# Patient Record
Sex: Male | Born: 1955 | Race: White | Hispanic: No | Marital: Married | Smoking: Never smoker
Health system: Southern US, Community
[De-identification: ages and names within clinical notes are randomized; demographics above are authoritative.]

## PROBLEM LIST (undated history)

## (undated) DIAGNOSIS — I10 Essential (primary) hypertension: Secondary | ICD-10-CM

## (undated) DIAGNOSIS — E119 Type 2 diabetes mellitus without complications: Secondary | ICD-10-CM

## (undated) HISTORY — DX: Type 2 diabetes mellitus without complications: E11.9

## (undated) HISTORY — PX: ADENOIDECTOMY: SUR15

## (undated) HISTORY — PX: OTHER SURGICAL HISTORY: SHX169

---

## 2015-12-03 ENCOUNTER — Encounter: Payer: Self-pay | Admitting: *Deleted

## 2015-12-03 ENCOUNTER — Encounter: Payer: BLUE CROSS/BLUE SHIELD | Attending: Family Medicine | Admitting: *Deleted

## 2015-12-03 VITALS — BP 130/72 | Ht 68.0 in | Wt 133.7 lb

## 2015-12-03 DIAGNOSIS — E119 Type 2 diabetes mellitus without complications: Secondary | ICD-10-CM | POA: Diagnosis present

## 2015-12-03 NOTE — Patient Instructions (Addendum)
Check blood sugars 2 x day before breakfast and 2 hrs after supper every day Exercise: Continue walking for   45-50  minutes  7  days a week Eat 3 meals day,  2-3  snacks a day Space meals 4-6 hours apart Bring blood sugar records to the next class Carry fast acting glucose and a snack at all times

## 2015-12-04 NOTE — Progress Notes (Signed)
Diabetes Self-Management Education  Visit Type: First/Initial  Appt. Start Time: 1600 Appt. End Time: 1715  12/03/2015  Terrance Watson, identified by name and date of birth, is a 60 y.o. male with a diagnosis of Diabetes: Type 2.   ASSESSMENT  Blood pressure 130/72, height  (1.727 m), weight 133 lb 11.2 oz (60.646 kg). Body mass index is 20.33 kg/(m^2).      Diabetes Self-Management Education - 12/03/15 1739    Visit Information   Visit Type First/Initial   Initial Visit   Diabetes Type Type 2   Are you currently following a meal plan? Yes   What type of meal plan do you follow? "cut out all sodas and cut back on sugar intake"   Are you taking your medications as prescribed? Yes   Date Diagnosed 10/23/15   Health Coping   How would you rate your overall health? Good   Psychosocial Assessment   Patient Belief/Attitude about Diabetes Motivated to manage diabetes   Self-care barriers None   Self-management support Doctor's office;Family   Other persons present Spouse/SO   Patient Concerns Nutrition/Meal planning;Glycemic Control;Medication;Problem Solving;Healthy Lifestyle   Special Needs None   Preferred Learning Style Auditory;Visual;Hands on   Learning Readiness Change in progress   How often do you need to have someone help you when you read instructions, pamphlets, or other written materials from your doctor or pharmacy? 1 - Never   What is the last grade level you completed in school? college   Pre-Education Assessment   Patient understands the diabetes disease and treatment process. Needs Instruction   Patient understands incorporating nutritional management into lifestyle. Needs Instruction   Patient undertands incorporating physical activity into lifestyle. Needs Instruction   Patient understands using medications safely. Needs Instruction   Patient understands monitoring blood glucose, interpreting and using results Needs Instruction   Patient understands  prevention, detection, and treatment of acute complications. Needs Instruction   Patient understands prevention, detection, and treatment of chronic complications. Needs Instruction   Patient understands how to develop strategies to address psychosocial issues. Needs Instruction   Patient understands how to develop strategies to promote health/change behavior. Needs Instruction   Complications   Last HgB A1C per patient/outside source 13.2 %  10/23/15   How often do you check your blood sugar? 3-4 times/day   Fasting Blood glucose range (mg/dL) 19-147;829-562  Pt reports FBG's 120-154 mg/dL   Postprandial Blood glucose range (mg/dL) --  pre-lunch and pre-supper readings average 130-135 mg/dL   Have you had a dilated eye exam in the past 12 months? Yes   Have you had a dental exam in the past 12 months? Yes   Are you checking your feet? Yes   How many days per week are you checking your feet? 7   Dietary Intake   Breakfast eggs, tomato, cheese and ham omelet; cereal and milk with fruit   Snack (morning) 1/2 [place] yogurt with nuts; 1/2 granola bar   Lunch grilled chicken salad or just grilled chicken; soft taco   Snack (afternoon) apple and peanut butter; 1/2 banana   Dinner meat and peas or beans; non-starchy vegetables   Snack (evening) popcorn, nuts   Beverage(s) water   Exercise   Exercise Type Light (walking / raking leaves)   How many days per week to you exercise? 7   How many minutes per day do you exercise? 45   Total minutes per week of exercise 315   Patient Education  Previous Diabetes Education No   Disease state  Definition of diabetes, type 1 and 2, and the diagnosis of diabetes   Nutrition management  Role of diet in the treatment of diabetes and the relationship between the three main macronutrients and blood glucose level   Physical activity and exercise  Role of exercise on diabetes management, blood pressure control and cardiac health.   Medications Reviewed  patients medication for diabetes, action, purpose, timing of dose and side effects.   Monitoring Purpose and frequency of SMBG.;Identified appropriate SMBG and/or A1C goals.   Acute complications Taught treatment of hypoglycemia - the 15 rule.   Chronic complications Relationship between chronic complications and blood glucose control   Psychosocial adjustment Identified and addressed patients feelings and concerns about diabetes   Individualized Goals (developed by patient)   Reducing Risk Improve blood sugars Decrease medications Prevent diabetes complications Lead a healthier lifestyle Become more fit   Outcomes   Expected Outcomes Demonstrated interest in learning. Expect positive outcomes   Future DMSE 4-6 wks      Individualized Plan for Diabetes Self-Management Training:   Learning Objective:  Patient will have a greater understanding of diabetes self-management. Patient education plan is to attend individual and/or group sessions per assessed needs and concerns.   Plan:   Patient Instructions  Check blood sugars 2 x day before breakfast and 2 hrs after supper every day Exercise: Continue walking for   45-50  minutes  7  days a week Eat 3 meals day,  2-3  snacks a day Space meals 4-6 hours apart Bring blood sugar records to the next class Carry fast acting glucose and a snack at all times   Expected Outcomes:  Demonstrated interest in learning. Expect positive outcomes  Education material provided:  General Meal Planning Guidelines Simple Meal Plan Symptoms, causes and treatments of Hypoglycemia  If problems or questions, patient to contact team via:  Sharion Settler, RN, CCM, CDE [phone]  Future DSME appointment: 4-6 wks  January 07, 2016 for Diabetes Class 1

## 2015-12-06 ENCOUNTER — Encounter (INDEPENDENT_AMBULATORY_CARE_PROVIDER_SITE_OTHER): Payer: Self-pay

## 2016-01-07 ENCOUNTER — Encounter: Payer: Self-pay | Admitting: Dietician

## 2016-01-07 ENCOUNTER — Encounter: Payer: BLUE CROSS/BLUE SHIELD | Attending: Family Medicine | Admitting: Dietician

## 2016-01-07 VITALS — Ht 68.0 in | Wt 140.5 lb

## 2016-01-07 DIAGNOSIS — E119 Type 2 diabetes mellitus without complications: Secondary | ICD-10-CM

## 2016-01-07 NOTE — Progress Notes (Signed)

## 2016-01-14 ENCOUNTER — Encounter: Payer: Self-pay | Admitting: Dietician

## 2016-01-14 ENCOUNTER — Encounter: Payer: BLUE CROSS/BLUE SHIELD | Attending: Family Medicine | Admitting: Dietician

## 2016-01-14 VITALS — Ht 68.0 in | Wt 139.3 lb

## 2016-01-14 DIAGNOSIS — E119 Type 2 diabetes mellitus without complications: Secondary | ICD-10-CM | POA: Insufficient documentation

## 2016-01-14 NOTE — Progress Notes (Signed)

## 2016-01-21 ENCOUNTER — Encounter: Payer: BLUE CROSS/BLUE SHIELD | Admitting: Dietician

## 2016-01-21 ENCOUNTER — Encounter: Payer: Self-pay | Admitting: Dietician

## 2016-01-21 VITALS — BP 122/74 | Ht 68.0 in | Wt 140.7 lb

## 2016-01-21 DIAGNOSIS — E119 Type 2 diabetes mellitus without complications: Secondary | ICD-10-CM

## 2016-01-21 NOTE — Progress Notes (Signed)

## 2016-01-30 ENCOUNTER — Encounter: Payer: Self-pay | Admitting: *Deleted

## 2016-02-18 ENCOUNTER — Ambulatory Visit: Payer: BLUE CROSS/BLUE SHIELD | Admitting: Anesthesiology

## 2016-02-18 ENCOUNTER — Ambulatory Visit
Admission: RE | Admit: 2016-02-18 | Discharge: 2016-02-18 | Disposition: A | Discharge status: Home / Self Care | Payer: BLUE CROSS/BLUE SHIELD | Source: Ambulatory Visit | Attending: Gastroenterology | Admitting: Gastroenterology

## 2016-02-18 ENCOUNTER — Encounter: Payer: Self-pay | Admitting: *Deleted

## 2016-02-18 ENCOUNTER — Encounter
Admission: RE | Disposition: A | Discharge status: Home / Self Care | Payer: Self-pay | Source: Ambulatory Visit | Attending: Gastroenterology

## 2016-02-18 DIAGNOSIS — Z7984 Long term (current) use of oral hypoglycemic drugs: Secondary | ICD-10-CM | POA: Diagnosis not present

## 2016-02-18 DIAGNOSIS — K640 First degree hemorrhoids: Secondary | ICD-10-CM | POA: Insufficient documentation

## 2016-02-18 DIAGNOSIS — Z880 Allergy status to penicillin: Secondary | ICD-10-CM | POA: Diagnosis not present

## 2016-02-18 DIAGNOSIS — K5730 Diverticulosis of large intestine without perforation or abscess without bleeding: Secondary | ICD-10-CM | POA: Diagnosis not present

## 2016-02-18 DIAGNOSIS — K2960 Other gastritis without bleeding: Secondary | ICD-10-CM | POA: Diagnosis not present

## 2016-02-18 DIAGNOSIS — K210 Gastro-esophageal reflux disease with esophagitis: Secondary | ICD-10-CM | POA: Diagnosis not present

## 2016-02-18 DIAGNOSIS — E119 Type 2 diabetes mellitus without complications: Secondary | ICD-10-CM | POA: Insufficient documentation

## 2016-02-18 DIAGNOSIS — K317 Polyp of stomach and duodenum: Secondary | ICD-10-CM | POA: Diagnosis not present

## 2016-02-18 DIAGNOSIS — R634 Abnormal weight loss: Secondary | ICD-10-CM | POA: Diagnosis present

## 2016-02-18 HISTORY — PX: ESOPHAGOGASTRODUODENOSCOPY (EGD) WITH PROPOFOL: SHX5813

## 2016-02-18 HISTORY — PX: COLONOSCOPY WITH PROPOFOL: SHX5780

## 2016-02-18 LAB — GLUCOSE, CAPILLARY: GLUCOSE-CAPILLARY: 135 mg/dL — AB (ref 65–99)

## 2016-02-18 SURGERY — COLONOSCOPY WITH PROPOFOL
Anesthesia: General

## 2016-02-18 MED ORDER — EPHEDRINE SULFATE 50 MG/ML IJ SOLN
INTRAMUSCULAR | Status: DC | PRN
  Administered 2016-02-18: 5 mg via INTRAVENOUS

## 2016-02-18 MED ORDER — FENTANYL CITRATE (PF) 100 MCG/2ML IJ SOLN
INTRAMUSCULAR | Status: DC | PRN
  Administered 2016-02-18: 50 ug via INTRAVENOUS

## 2016-02-18 MED ORDER — PROPOFOL 500 MG/50ML IV EMUL
INTRAVENOUS | Status: DC | PRN
  Administered 2016-02-18: 120 ug/kg/min via INTRAVENOUS

## 2016-02-18 MED ORDER — SODIUM CHLORIDE 0.9 % IV SOLN
INTRAVENOUS | Status: DC
  Administered 2016-02-18: 1000 mL via INTRAVENOUS
  Administered 2016-02-18: 09:00:00 via INTRAVENOUS

## 2016-02-18 MED ORDER — MIDAZOLAM HCL 2 MG/2ML IJ SOLN
INTRAMUSCULAR | Status: DC | PRN
Start: 2016-02-18 — End: 2016-02-18
  Administered 2016-02-18: 1 mg via INTRAVENOUS

## 2016-02-18 MED ORDER — SODIUM CHLORIDE 0.9 % IV SOLN: INTRAVENOUS | Status: DC

## 2016-02-18 NOTE — Anesthesia Preprocedure Evaluation (Signed)
Anesthesia Evaluation  Patient identified by MRN, date of birth, ID band Patient awake    Reviewed: Allergy & Precautions, H&P , NPO status , Patient's Chart, lab work & pertinent test results, reviewed documented beta blocker date and time   Airway Mallampati: II   Neck ROM: full    Dental  (+) Poor Dentition   Pulmonary neg pulmonary ROS,    Pulmonary exam normal        Cardiovascular negative cardio ROS Normal cardiovascular exam Rhythm:regular Rate:Normal     Neuro/Psych negative neurological ROS  negative psych ROS   GI/Hepatic negative GI ROS, Neg liver ROS,   Endo/Other  negative endocrine ROSdiabetes  Renal/GU negative Renal ROS  negative genitourinary   Musculoskeletal   Abdominal   Peds  Hematology negative hematology ROS (+)   Anesthesia Other Findings Past Medical History: No date: Diabetes mellitus without complication (HCC) Past Surgical History: No date: ADENOIDECTOMY No date: Surgery for broken nose BMI    Body Mass Index:  20.68 kg/m     Reproductive/Obstetrics                             Anesthesia Physical Anesthesia Plan  ASA: II  Anesthesia Plan: General   Post-op Pain Management:    Induction:   Airway Management Planned:   Additional Equipment:   Intra-op Plan:   Post-operative Plan:   Informed Consent: I have reviewed the patients History and Physical, chart, labs and discussed the procedure including the risks, benefits and alternatives for the proposed anesthesia with the patient or authorized representative who has indicated his/her understanding and acceptance.   Dental Advisory Given  Plan Discussed with: CRNA  Anesthesia Plan Comments:         Anesthesia Quick Evaluation

## 2016-02-18 NOTE — Anesthesia Postprocedure Evaluation (Signed)
Anesthesia Post Note  Patient: Terrance Watson  Procedure(s) Performed: Procedure(s) (LRB): COLONOSCOPY WITH PROPOFOL (N/A) ESOPHAGOGASTRODUODENOSCOPY (EGD) WITH PROPOFOL (N/A)  Patient location during evaluation: PACU Anesthesia Type: General Level of consciousness: awake and alert Pain management: pain level controlled Vital Signs Assessment: post-procedure vital signs reviewed and stable Respiratory status: spontaneous breathing, nonlabored ventilation, respiratory function stable and patient connected to nasal cannula oxygen Cardiovascular status: blood pressure returned to baseline and stable Postop Assessment: no signs of nausea or vomiting Anesthetic complications: no    Last Vitals:  Vitals:   02/18/16 1016 02/18/16 1026  BP: 137/82 140/81  Pulse: 94 95  Resp: 19 13  Temp:      Last Pain:  Vitals:   02/18/16 0956  TempSrc: Tympanic                 Yevette Edwards

## 2016-02-18 NOTE — Op Note (Signed)
[facility] Gastroenterology Patient Name: Terrance Watson Procedure Date: 02/18/2016 8:52 AM MRN: [id number] Account #: [id number] Date of Birth: [birth year] Admit Type: Outpatient Age: 60 Room: [facility] ENDO ROOM 3 Gender: Male Note Status: Finalized Procedure:            Colonoscopy Indications:          Change in bowel habits, Weight loss Providers:            Christena Deem, MD Referring MD:         Hassell Halim MD (Referring MD) Medicines:            Monitored Anesthesia Care Complications:        No immediate complications. Procedure:            Pre-Anesthesia Assessment:                       - ASA Grade Assessment: II - A patient with mild                        systemic disease.                       After obtaining informed consent, the colonoscope was                        passed under direct vision. Throughout the procedure,                        the patient's blood pressure, pulse, and oxygen                        saturations were monitored continuously. The                        Colonoscope was introduced through the anus and                        advanced to the the cecum, identified by appendiceal                        orifice and ileocecal valve. The colonoscopy was                        unusually difficult due to poor bowel prep and a                        tortuous colon. Successful completion of the procedure                        was aided by lavage. The patient tolerated the                        procedure well. The quality of the bowel preparation                        was fair. Findings:      Multiple medium-mouthed diverticula were found in the sigmoid colon and       descending colon.      The sigmoid colon and descending colon were significantly tortuous.      Biopsies for histology were  taken with a cold forceps from the right       colon and left colon for evaluation of microscopic colitis.      Non-bleeding internal  hemorrhoids were found during anoscopy. The       hemorrhoids were small and Grade I (internal hemorrhoids that do not       prolapse).      The digital rectal exam was normal. Impression:           - Preparation of the colon was fair.                       - Diverticulosis in the sigmoid colon and in the                        descending colon.                       - Tortuous colon.                       - Non-bleeding internal hemorrhoids.                       - Biopsies were taken with a cold forceps from the                        right colon and left colon for evaluation of                        microscopic colitis. Recommendation:       - Discharge patient to home. Procedure Code(s):    --- Professional ---                       [phone], Colonoscopy, flexible; with biopsy, single or                        multiple Diagnosis Code(s):    --- Professional ---                       K64.0, First degree hemorrhoids                       R19.4, Change in bowel habit                       R63.4, Abnormal weight loss                       K57.30, Diverticulosis of large intestine without                        perforation or abscess without bleeding                       Q43.8, Other specified congenital malformations of                        intestine CPT copyright 2016 American Medical Association. All rights reserved. The codes documented in this report are preliminary and upon coder review may  be revised to meet current compliance requirements. Christena Deem, MD 02/18/2016 9:54:45 AM This report has been signed electronically. Number of Addenda: 0  Note Initiated On: 02/18/2016 8:52 AM Scope Withdrawal Time: 0 hours 7 minutes 41 seconds  Total Procedure Duration: 0 hours 31 minutes 11 seconds       [facility]

## 2016-02-18 NOTE — Transfer of Care (Signed)
Immediate Anesthesia Transfer of Care Note  Patient: Hendryx Snidow  Procedure(s) Performed: Procedure(s): COLONOSCOPY WITH PROPOFOL (N/A) ESOPHAGOGASTRODUODENOSCOPY (EGD) WITH PROPOFOL (N/A)  Patient Location: PACU  Anesthesia Type:General  Level of Consciousness: awake  Airway & Oxygen Therapy: Patient Spontanous Breathing and Patient connected to face mask oxygen  Post-op Assessment: Report given to RN and Post -op Vital signs reviewed and stable  Post vital signs: Reviewed and stable  Last Vitals:  Vitals:   02/18/16 0747  BP: (!) 156/93  Pulse: 98  Resp: 14  Temp: 36.6 C    Last Pain:  Vitals:   02/18/16 0747  TempSrc: Tympanic         Complications: No apparent anesthesia complications

## 2016-02-18 NOTE — Op Note (Signed)
[facility] Gastroenterology Patient Name: Terrance Watson Procedure Date: 02/18/2016 8:52 AM MRN: [id number] Account #: [id number] Date of Birth: [birth year] Admit Type: Outpatient Age: 60 Room: [facility] ENDO ROOM 3 Gender: Male Note Status: Finalized Procedure:            Upper GI endoscopy Indications:          Dyspepsia Providers:            Christena Deem, MD Referring MD:         Hassell Halim MD (Referring MD) Complications:        No immediate complications. Procedure:            Pre-Anesthesia Assessment:                       - ASA Grade Assessment: II - A patient with mild                        systemic disease.                       After obtaining informed consent, the endoscope was                        passed under direct vision. Throughout the procedure,                        the patient's blood pressure, pulse, and oxygen                        saturations were monitored continuously. The Endoscope                        was introduced through the mouth, and advanced to the                        third part of duodenum. The upper GI endoscopy was                        accomplished without difficulty. The patient tolerated                        the procedure well. Findings:      LA Grade B (one or more mucosal breaks greater than 5 mm, not extending       between the tops of two mucosal folds) esophagitis with no bleeding was       found. Biopsies were taken with a cold forceps for histology.      The exam of the esophagus was otherwise normal.      Diffuse and patchy mild inflammation characterized by congestion (edema)       and erythema was found in the gastric body and in the gastric antrum.       Biopsies were taken with a cold forceps for histology.      The cardia and gastric fundus were normal on retroflexion.      A few 1 mm sessile polyps with no bleeding were found in the gastric       body.      The examined duodenum was  normal. Impression:           - LA Grade B erosive esophagitis. Biopsied.                       -  Erosive gastritis. Biopsied.                       - A few gastric polyps.                       - Normal examined duodenum. Recommendation:       - Discharge patient to home.                       - Use Prilosec (omeprazole) 20 mg PO daily daily.                       - Await pathology results. Procedure Code(s):    --- Professional ---                       [phone], Esophagogastroduodenoscopy, flexible, transoral;                        with biopsy, single or multiple Diagnosis Code(s):    --- Professional ---                       K20.8, Other esophagitis                       K29.60, Other gastritis without bleeding                       K31.7, Polyp of stomach and duodenum                       R10.13, Epigastric pain CPT copyright 2016 American Medical Association. All rights reserved. The codes documented in this report are preliminary and upon coder review may  be revised to meet current compliance requirements. Christena DeemMartin U Skulskie, MD 02/18/2016 9:14:10 AM This report has been signed electronically. Number of Addenda: 0 Note Initiated On: 02/18/2016 8:52 AM      [facility]

## 2016-02-18 NOTE — H&P (Signed)
Outpatient short stay form Pre-procedure 02/18/2016 8:51 AM Christena Deem MD  Primary Physician: Dr Kandyce Rud  Reason for visit:  EGD and colonoscopy  History of present illness:  Patient is a 60 year old male presenting today as above. He has a history of some recent weight loss however this seems to improve after his diagnosis and beginning treatment for diabetes. He has had a change of bowel habits of preexisted he is beginning metformin. He also has some amount of dyspepsia. This has responded to use of proton pump inhibitor. He denies use of aspirin or blood thinning agents.    Current Facility-Administered Medications:  .  0.9 %  sodium chloride infusion, , Intravenous, Continuous, Christena Deem, MD, Last Rate: 20 mL/hr at 02/18/16 0805, 1,000 mL at 02/18/16 0805 .  0.9 %  sodium chloride infusion, , Intravenous, Continuous, Christena Deem, MD  Prescriptions Prior to Admission  Medication Sig Dispense Refill Last Dose  . metFORMIN (GLUCOPHAGE-XR) 750 MG 24 hr tablet Take 1,000 mg by mouth daily with supper.     . Multiple Vitamin (MULTIVITAMIN) tablet Take 1 tablet by mouth daily.     Marland Kitchen saccharomyces boulardii (FLORASTOR) 250 MG capsule Take 250 mg by mouth every morning.     Marland Kitchen glipiZIDE (GLUCOTROL) 5 MG tablet Take 5 mg by mouth daily before breakfast.   Taking  . metFORMIN (GLUCOPHAGE) 500 MG tablet Take 1,000 mg by mouth daily with supper.   Completed Course at Unknown time  . omeprazole (PRILOSEC) 20 MG capsule Take 20 mg by mouth daily.   Taking     Allergies  Allergen Reactions  . Penicillins Rash     Past Medical History:  Diagnosis Date  . Diabetes mellitus without complication (HCC)     Review of systems:      Physical Exam    Heart and lungs: Regular rate and rhythm without rub or gallop, lungs are bilaterally clear.    HEENT: Normocephalic atraumatic eyes are anicteric    Other:     Pertinant exam for procedure: Soft nontender  nondistended bowel sounds positive normoactive.    Planned proceedures: EGD, colonoscopy and indicated procedures. I have discussed the risks benefits and complications of procedures to include not limited to bleeding, infection, perforation and the risk of sedation and the patient wishes to proceed.    Christena Deem, MD Gastroenterology 02/18/2016  8:51 AM

## 2016-02-18 NOTE — Anesthesia Postprocedure Evaluation (Signed)
Anesthesia Post Note  Patient: Nivaan Clayton  Procedure(s) Performed: Procedure(s) (LRB): COLONOSCOPY WITH PROPOFOL (N/A) ESOPHAGOGASTRODUODENOSCOPY (EGD) WITH PROPOFOL (N/A)  Patient location during evaluation: PACU Anesthesia Type: General Level of consciousness: awake and alert Pain management: pain level controlled Vital Signs Assessment: post-procedure vital signs reviewed and stable Respiratory status: spontaneous breathing, nonlabored ventilation, respiratory function stable and patient connected to nasal cannula oxygen Cardiovascular status: blood pressure returned to baseline and stable Postop Assessment: no signs of nausea or vomiting Anesthetic complications: no    Last Vitals:  Vitals:   02/18/16 1016 02/18/16 1026  BP: 137/82 140/81  Pulse: 94 95  Resp: 19 13  Temp:      Last Pain:  Vitals:   02/18/16 0956  TempSrc: Tympanic                 Lashaunda Schild G Devyn Griffing     

## 2016-02-18 NOTE — Anesthesia Procedure Notes (Signed)
Performed by: COOK-MARTIN, Annasofia Pohl Pre-anesthesia Checklist: Patient identified, Emergency Drugs available, Suction available, Patient being monitored and Timeout performed Patient Re-evaluated:Patient Re-evaluated prior to inductionOxygen Delivery Method: Nasal cannula Preoxygenation: Pre-oxygenation with 100% oxygen Intubation Type: IV induction Placement Confirmation: positive ETCO2 and CO2 detector       

## 2016-02-19 ENCOUNTER — Encounter: Payer: Self-pay | Admitting: Gastroenterology

## 2016-02-19 LAB — SURGICAL PATHOLOGY

## 2019-12-05 ENCOUNTER — Inpatient Hospital Stay
Admission: EM | Admit: 2019-12-05 | Discharge: 2020-01-08 | DRG: 291 | Disposition: E | Payer: BC Managed Care – PPO | Attending: Internal Medicine | Admitting: Internal Medicine

## 2019-12-05 ENCOUNTER — Inpatient Hospital Stay
Admit: 2019-12-05 | Discharge: 2019-12-05 | Disposition: A | Discharge status: Home / Self Care | Payer: BC Managed Care – PPO | Attending: Internal Medicine | Admitting: Internal Medicine

## 2019-12-05 ENCOUNTER — Emergency Department: Payer: BC Managed Care – PPO

## 2019-12-05 ENCOUNTER — Other Ambulatory Visit: Payer: Self-pay

## 2019-12-05 DIAGNOSIS — I509 Heart failure, unspecified: Secondary | ICD-10-CM | POA: Diagnosis not present

## 2019-12-05 DIAGNOSIS — I428 Other cardiomyopathies: Secondary | ICD-10-CM | POA: Diagnosis present

## 2019-12-05 DIAGNOSIS — I340 Nonrheumatic mitral (valve) insufficiency: Secondary | ICD-10-CM | POA: Diagnosis present

## 2019-12-05 DIAGNOSIS — I462 Cardiac arrest due to underlying cardiac condition: Secondary | ICD-10-CM | POA: Diagnosis not present

## 2019-12-05 DIAGNOSIS — N1831 Chronic kidney disease, stage 3a: Secondary | ICD-10-CM | POA: Diagnosis present

## 2019-12-05 DIAGNOSIS — E872 Acidosis: Secondary | ICD-10-CM | POA: Diagnosis present

## 2019-12-05 DIAGNOSIS — J9601 Acute respiratory failure with hypoxia: Secondary | ICD-10-CM | POA: Diagnosis present

## 2019-12-05 DIAGNOSIS — R591 Generalized enlarged lymph nodes: Secondary | ICD-10-CM | POA: Diagnosis present

## 2019-12-05 DIAGNOSIS — N179 Acute kidney failure, unspecified: Secondary | ICD-10-CM | POA: Diagnosis present

## 2019-12-05 DIAGNOSIS — Z20822 Contact with and (suspected) exposure to covid-19: Secondary | ICD-10-CM | POA: Diagnosis present

## 2019-12-05 DIAGNOSIS — Z7984 Long term (current) use of oral hypoglycemic drugs: Secondary | ICD-10-CM

## 2019-12-05 DIAGNOSIS — J9691 Respiratory failure, unspecified with hypoxia: Secondary | ICD-10-CM | POA: Diagnosis present

## 2019-12-05 DIAGNOSIS — Z79899 Other long term (current) drug therapy: Secondary | ICD-10-CM

## 2019-12-05 DIAGNOSIS — R042 Hemoptysis: Secondary | ICD-10-CM | POA: Diagnosis not present

## 2019-12-05 DIAGNOSIS — E785 Hyperlipidemia, unspecified: Secondary | ICD-10-CM | POA: Diagnosis present

## 2019-12-05 DIAGNOSIS — N183 Chronic kidney disease, stage 3 unspecified: Secondary | ICD-10-CM | POA: Diagnosis not present

## 2019-12-05 DIAGNOSIS — T502X5A Adverse effect of carbonic-anhydrase inhibitors, benzothiadiazides and other diuretics, initial encounter: Secondary | ICD-10-CM | POA: Diagnosis not present

## 2019-12-05 DIAGNOSIS — D631 Anemia in chronic kidney disease: Secondary | ICD-10-CM | POA: Diagnosis present

## 2019-12-05 DIAGNOSIS — Z880 Allergy status to penicillin: Secondary | ICD-10-CM

## 2019-12-05 DIAGNOSIS — E1122 Type 2 diabetes mellitus with diabetic chronic kidney disease: Secondary | ICD-10-CM | POA: Diagnosis present

## 2019-12-05 DIAGNOSIS — R7989 Other specified abnormal findings of blood chemistry: Secondary | ICD-10-CM | POA: Diagnosis present

## 2019-12-05 DIAGNOSIS — R778 Other specified abnormalities of plasma proteins: Secondary | ICD-10-CM | POA: Diagnosis present

## 2019-12-05 DIAGNOSIS — J9811 Atelectasis: Secondary | ICD-10-CM | POA: Diagnosis present

## 2019-12-05 DIAGNOSIS — R918 Other nonspecific abnormal finding of lung field: Secondary | ICD-10-CM | POA: Diagnosis present

## 2019-12-05 DIAGNOSIS — I5021 Acute systolic (congestive) heart failure: Secondary | ICD-10-CM | POA: Diagnosis present

## 2019-12-05 DIAGNOSIS — E119 Type 2 diabetes mellitus without complications: Secondary | ICD-10-CM | POA: Diagnosis not present

## 2019-12-05 DIAGNOSIS — E11319 Type 2 diabetes mellitus with unspecified diabetic retinopathy without macular edema: Secondary | ICD-10-CM | POA: Diagnosis present

## 2019-12-05 DIAGNOSIS — I472 Ventricular tachycardia: Secondary | ICD-10-CM | POA: Diagnosis not present

## 2019-12-05 DIAGNOSIS — E1140 Type 2 diabetes mellitus with diabetic neuropathy, unspecified: Secondary | ICD-10-CM | POA: Diagnosis present

## 2019-12-05 DIAGNOSIS — E1165 Type 2 diabetes mellitus with hyperglycemia: Secondary | ICD-10-CM | POA: Diagnosis present

## 2019-12-05 DIAGNOSIS — E875 Hyperkalemia: Secondary | ICD-10-CM | POA: Diagnosis present

## 2019-12-05 DIAGNOSIS — I130 Hypertensive heart and chronic kidney disease with heart failure and stage 1 through stage 4 chronic kidney disease, or unspecified chronic kidney disease: Secondary | ICD-10-CM | POA: Diagnosis present

## 2019-12-05 DIAGNOSIS — J189 Pneumonia, unspecified organism: Secondary | ICD-10-CM | POA: Diagnosis present

## 2019-12-05 DIAGNOSIS — Z9889 Other specified postprocedural states: Secondary | ICD-10-CM

## 2019-12-05 DIAGNOSIS — Z833 Family history of diabetes mellitus: Secondary | ICD-10-CM

## 2019-12-05 DIAGNOSIS — N170 Acute kidney failure with tubular necrosis: Secondary | ICD-10-CM | POA: Diagnosis not present

## 2019-12-05 HISTORY — DX: Essential (primary) hypertension: I10

## 2019-12-05 LAB — APTT: aPTT: 24 seconds (ref 24–36)

## 2019-12-05 LAB — LACTIC ACID, PLASMA
Lactic Acid, Venous: 2.2 mmol/L (ref 0.5–1.9)
Lactic Acid, Venous: 2.2 mmol/L (ref 0.5–1.9)

## 2019-12-05 LAB — BASIC METABOLIC PANEL
Anion gap: 12 (ref 5–15)
BUN: 29 mg/dL — ABNORMAL HIGH (ref 8–23)
CO2: 26 mmol/L (ref 22–32)
Calcium: 8.5 mg/dL — ABNORMAL LOW (ref 8.9–10.3)
Chloride: 98 mmol/L (ref 98–111)
Creatinine, Ser: 1.62 mg/dL — ABNORMAL HIGH (ref 0.61–1.24)
GFR calc Af Amer: 52 mL/min — ABNORMAL LOW (ref >60)
GFR calc non Af Amer: 45 mL/min — ABNORMAL LOW (ref >60)
Glucose, Bld: 237 mg/dL — ABNORMAL HIGH (ref 70–99)
Potassium: 5.3 mmol/L — ABNORMAL HIGH (ref 3.5–5.1)
Sodium: 136 mmol/L (ref 135–145)

## 2019-12-05 LAB — TROPONIN I (HIGH SENSITIVITY)
Troponin I (High Sensitivity): 151 ng/L (ref <18)
Troponin I (High Sensitivity): 144 ng/L (ref <18)
Troponin I (High Sensitivity): 129 ng/L (ref <18)
Troponin I (High Sensitivity): 154 ng/L (ref <18)

## 2019-12-05 LAB — BLOOD GAS, ARTERIAL
Acid-base deficit: 0.7 mmol/L (ref 0.0–2.0)
Bicarbonate: 25.4 mmol/L (ref 20.0–28.0)
FIO2: 0.54
O2 Saturation: 87.9 %
Patient temperature: 37
pCO2 arterial: 47 mmHg (ref 32.0–48.0)
pH, Arterial: 7.34 — ABNORMAL LOW (ref 7.350–7.450)
pO2, Arterial: 58 mmHg — ABNORMAL LOW (ref 83.0–108.0)

## 2019-12-05 LAB — HEPARIN LEVEL (UNFRACTIONATED)
Heparin Unfractionated: >3.6 IU/mL — ABNORMAL HIGH (ref 0.30–0.70)
Heparin Unfractionated: 0.25 IU/mL — ABNORMAL LOW (ref 0.30–0.70)

## 2019-12-05 LAB — PROTIME-INR
INR: 1.1 (ref 0.8–1.2)
Prothrombin Time: 13.3 seconds (ref 11.4–15.2)

## 2019-12-05 LAB — ECHOCARDIOGRAM COMPLETE
Height: 68 in
Weight: 2080 oz

## 2019-12-05 LAB — HEMOGLOBIN A1C
Hgb A1c MFr Bld: 7.2 % — ABNORMAL HIGH (ref 4.8–5.6)
Mean Plasma Glucose: 159.94 mg/dL

## 2019-12-05 LAB — CBC
HCT: 33.1 % — ABNORMAL LOW (ref 39.0–52.0)
Hemoglobin: 10.8 g/dL — ABNORMAL LOW (ref 13.0–17.0)
MCH: 29.3 pg (ref 26.0–34.0)
MCHC: 32.6 g/dL (ref 30.0–36.0)
MCV: 89.9 fL (ref 80.0–100.0)
Platelets: 268 10*3/uL (ref 150–400)
RBC: 3.68 MIL/uL — ABNORMAL LOW (ref 4.22–5.81)
RDW: 13.8 % (ref 11.5–15.5)
WBC: 8.4 10*3/uL (ref 4.0–10.5)
nRBC: 0 % (ref 0.0–0.2)

## 2019-12-05 LAB — HEPATIC FUNCTION PANEL
ALT: 26 U/L (ref 0–44)
AST: 27 U/L (ref 15–41)
Albumin: 3.3 g/dL — ABNORMAL LOW (ref 3.5–5.0)
Alkaline Phosphatase: 85 U/L (ref 38–126)
Bilirubin, Direct: 0.2 mg/dL (ref 0.0–0.2)
Indirect Bilirubin: 0.9 mg/dL (ref 0.3–0.9)
Total Bilirubin: 1.1 mg/dL (ref 0.3–1.2)
Total Protein: 6.3 g/dL — ABNORMAL LOW (ref 6.5–8.1)

## 2019-12-05 LAB — GLUCOSE, CAPILLARY
Glucose-Capillary: 203 mg/dL — ABNORMAL HIGH (ref 70–99)
Glucose-Capillary: 166 mg/dL — ABNORMAL HIGH (ref 70–99)
Glucose-Capillary: 136 mg/dL — ABNORMAL HIGH (ref 70–99)

## 2019-12-05 LAB — PROCALCITONIN: Procalcitonin: <0.1 ng/mL

## 2019-12-05 LAB — HIV ANTIBODY (ROUTINE TESTING W REFLEX): HIV Screen 4th Generation wRfx: NONREACTIVE

## 2019-12-05 LAB — SARS CORONAVIRUS 2 BY RT PCR (HOSPITAL ORDER, PERFORMED IN ~~LOC~~ HOSPITAL LAB): SARS Coronavirus 2: NEGATIVE

## 2019-12-05 LAB — BRAIN NATRIURETIC PEPTIDE: B Natriuretic Peptide: 718.3 pg/mL — ABNORMAL HIGH (ref 0.0–100.0)

## 2019-12-05 MED ORDER — PANTOPRAZOLE SODIUM 40 MG PO TBEC
40.0000 mg | DELAYED_RELEASE_TABLET | Freq: Every day | ORAL | Status: DC
  Administered 2019-12-07 – 2019-12-09 (×3): 40 mg via ORAL
  Filled 2019-12-05 (×4): qty 1

## 2019-12-05 MED ORDER — FUROSEMIDE 10 MG/ML IJ SOLN
60.0000 mg | Freq: Once | INTRAMUSCULAR | Status: AC
  Administered 2019-12-05: 60 mg via INTRAVENOUS
  Filled 2019-12-05: qty 8

## 2019-12-05 MED ORDER — FUROSEMIDE 10 MG/ML IJ SOLN
INTRAMUSCULAR | Status: AC
  Filled 2019-12-05: qty 4

## 2019-12-05 MED ORDER — FLUTICASONE PROPIONATE 50 MCG/ACT NA SUSP
2.0000 | Freq: Every day | NASAL | Status: DC
  Administered 2019-12-06 – 2019-12-09 (×4): 2 via NASAL
  Filled 2019-12-05: qty 16

## 2019-12-05 MED ORDER — ONDANSETRON HCL 4 MG PO TABS: 4.0000 mg | ORAL_TABLET | Freq: Four times a day (QID) | ORAL | Status: DC | PRN

## 2019-12-05 MED ORDER — SODIUM CHLORIDE 0.9% FLUSH: 3.0000 mL | INTRAVENOUS | Status: DC | PRN

## 2019-12-05 MED ORDER — ONDANSETRON HCL 4 MG/2ML IJ SOLN
4.0000 mg | Freq: Four times a day (QID) | INTRAMUSCULAR | Status: DC | PRN
  Administered 2019-12-06 – 2019-12-07 (×3): 4 mg via INTRAVENOUS
  Filled 2019-12-05 (×4): qty 2

## 2019-12-05 MED ORDER — FUROSEMIDE 10 MG/ML IJ SOLN
40.0000 mg | Freq: Two times a day (BID) | INTRAMUSCULAR | Status: DC
  Administered 2019-12-05 – 2019-12-07 (×5): 40 mg via INTRAVENOUS
  Filled 2019-12-05 (×5): qty 4

## 2019-12-05 MED ORDER — ACETAMINOPHEN 650 MG RE SUPP: 650.0000 mg | Freq: Four times a day (QID) | RECTAL | Status: DC | PRN

## 2019-12-05 MED ORDER — BENZONATATE 100 MG PO CAPS
100.0000 mg | ORAL_CAPSULE | Freq: Three times a day (TID) | ORAL | Status: DC
  Administered 2019-12-05 – 2019-12-07 (×5): 100 mg via ORAL
  Filled 2019-12-05 (×6): qty 1

## 2019-12-05 MED ORDER — ADULT MULTIVITAMIN W/MINERALS CH
1.0000 | ORAL_TABLET | Freq: Every day | ORAL | Status: DC
  Administered 2019-12-07 – 2019-12-08 (×2): 1 via ORAL
  Filled 2019-12-05 (×4): qty 1

## 2019-12-05 MED ORDER — SODIUM CHLORIDE 0.9 % IV SOLN: 2.0000 g | INTRAVENOUS | Status: DC

## 2019-12-05 MED ORDER — INSULIN ASPART 100 UNIT/ML ~~LOC~~ SOLN: 0.0000 [IU] | Freq: Every day | SUBCUTANEOUS | Status: DC

## 2019-12-05 MED ORDER — HEPARIN BOLUS VIA INFUSION
900.0000 [IU] | Freq: Once | INTRAVENOUS | Status: AC
  Administered 2019-12-05: 900 [IU] via INTRAVENOUS
  Filled 2019-12-05: qty 900

## 2019-12-05 MED ORDER — HEPARIN (PORCINE) 25000 UT/250ML-% IV SOLN
900.0000 [IU]/h | INTRAVENOUS | Status: DC
  Administered 2019-12-05: 700 [IU]/h via INTRAVENOUS
  Filled 2019-12-05: qty 250

## 2019-12-05 MED ORDER — SODIUM CHLORIDE 0.9% FLUSH
3.0000 mL | Freq: Two times a day (BID) | INTRAVENOUS | Status: DC
  Administered 2019-12-06 – 2019-12-09 (×7): 3 mL via INTRAVENOUS

## 2019-12-05 MED ORDER — FUROSEMIDE 10 MG/ML IJ SOLN
40.0000 mg/h | Freq: Two times a day (BID) | INTRAVENOUS | Status: DC
  Filled 2019-12-05: qty 25

## 2019-12-05 MED ORDER — HEPARIN BOLUS VIA INFUSION
3500.0000 [IU] | Freq: Once | INTRAVENOUS | Status: AC
  Administered 2019-12-05: 3500 [IU] via INTRAVENOUS
  Filled 2019-12-05: qty 3500

## 2019-12-05 MED ORDER — INSULIN ASPART 100 UNIT/ML ~~LOC~~ SOLN
0.0000 [IU] | Freq: Three times a day (TID) | SUBCUTANEOUS | Status: DC
  Administered 2019-12-06 – 2019-12-07 (×5): 2 [IU] via SUBCUTANEOUS
  Administered 2019-12-07: 1 [IU] via SUBCUTANEOUS
  Administered 2019-12-08: 2 [IU] via SUBCUTANEOUS
  Administered 2019-12-08: 3 [IU] via SUBCUTANEOUS
  Administered 2019-12-09: 2 [IU] via SUBCUTANEOUS
  Administered 2019-12-09: 3 [IU] via SUBCUTANEOUS
  Filled 2019-12-05 (×10): qty 1

## 2019-12-05 MED ORDER — ACETAMINOPHEN 325 MG PO TABS: 650.0000 mg | ORAL_TABLET | Freq: Four times a day (QID) | ORAL | Status: DC | PRN

## 2019-12-05 MED ORDER — SODIUM CHLORIDE 0.9 % IV SOLN
500.0000 mg | INTRAVENOUS | Status: AC
  Administered 2019-12-05 – 2019-12-06 (×2): 500 mg via INTRAVENOUS
  Filled 2019-12-05 (×2): qty 500

## 2019-12-05 MED ORDER — ENOXAPARIN SODIUM 30 MG/0.3ML ~~LOC~~ SOLN: 30.0000 mg | SUBCUTANEOUS | Status: DC

## 2019-12-05 MED ORDER — IOHEXOL 350 MG/ML SOLN
60.0000 mL | Freq: Once | INTRAVENOUS | Status: AC | PRN
  Administered 2019-12-05: 60 mL via INTRAVENOUS

## 2019-12-05 MED ORDER — SODIUM CHLORIDE 0.9 % IV SOLN: 250.0000 mL | INTRAVENOUS | Status: DC | PRN

## 2019-12-05 MED ORDER — SODIUM CHLORIDE 0.9 % IV SOLN
2.0000 g | INTRAVENOUS | Status: DC
  Administered 2019-12-05 – 2019-12-08 (×4): 2 g via INTRAVENOUS
  Filled 2019-12-05: qty 2
  Filled 2019-12-05 (×3): qty 20
  Filled 2019-12-05: qty 2

## 2019-12-05 MED ORDER — SODIUM CHLORIDE 0.9 % IV SOLN: 500.0000 mg | INTRAVENOUS | Status: DC

## 2019-12-05 MED ORDER — FUROSEMIDE 10 MG/ML IJ SOLN: 40.0000 mg | Freq: Once | INTRAMUSCULAR | Status: DC

## 2019-12-05 MED ORDER — SACCHAROMYCES BOULARDII 250 MG PO CAPS
250.0000 mg | ORAL_CAPSULE | Freq: Every morning | ORAL | Status: DC
  Administered 2019-12-07 – 2019-12-08 (×2): 250 mg via ORAL
  Filled 2019-12-05 (×4): qty 1

## 2019-12-05 NOTE — ED Notes (Signed)
Repeat heparin level drawn via straight stick from right arm

## 2019-12-05 NOTE — Progress Notes (Signed)
*  PRELIMINARY RESULTS* Echocardiogram 2D Echocardiogram has been performed.  Joanette Gula [place] Dec 17, 2019, 1:00 PM

## 2019-12-05 NOTE — ED Notes (Signed)
Heparin dose changed to 8 units/hour per pharmacy consult, 900 unit bolus given. Verified by this RN and Greta Doom, RN

## 2019-12-05 NOTE — ED Provider Notes (Signed)
[facility] Emergency Department Provider Note  ____________________________________________   First MD Initiated Contact with Patient 12/24/19 [phone]     (approximate)  I have reviewed the triage vital signs and the nursing notes.   HISTORY  Chief Complaint Respiratory Distress    HPI Terrance Watson is a 64 y.o. male with diabetes who comes in for respiratory distress.  Patient called EMS due to shortness of breath and feeling like he was going to pass out.  Never lost consciousness.  Patient had sats in the 70s on room air and was transported on a nonrebreather.  Patient states that he is been having some lung issues since June.  He was initially seen early June for laryngitis and got a treatment with some antibiotics stated that he started to feel better but then about 2 weeks ago he started to have shortness of breath again.  He was seen just a few days ago and was given a course of antibiotics.  States that he was never tested for Covid.  Patient denies losing consciousness, shortness of breath in the past few days, moderate, constant, nothing makes better, nothing is worse.  Denies any chest pain, leg swelling, history of PE.          Past Medical History:  Diagnosis Date  . Diabetes mellitus without complication (HCC)   . Hypertension     There are no problems to display for this patient.   Past Surgical History:  Procedure Laterality Date  . ADENOIDECTOMY    . COLONOSCOPY WITH PROPOFOL N/A 02/18/2016   Procedure: COLONOSCOPY WITH PROPOFOL;  Surgeon: Christena Deem, MD;  Location: [facility] ENDOSCOPY;  Service: Endoscopy;  Laterality: N/A;  . ESOPHAGOGASTRODUODENOSCOPY (EGD) WITH PROPOFOL N/A 02/18/2016   Procedure: ESOPHAGOGASTRODUODENOSCOPY (EGD) WITH PROPOFOL;  Surgeon: Christena Deem, MD;  Location: [facility] ENDOSCOPY;  Service: Endoscopy;  Laterality: N/A;  . Surgery for broken nose      Prior to Admission medications   Medication Sig Start  Date End Date Taking? Authorizing Provider  glipiZIDE (GLUCOTROL) 5 MG tablet Take 5 mg by mouth daily before breakfast.    [provider]  metFORMIN (GLUCOPHAGE) 500 MG tablet Take 1,000 mg by mouth daily with supper.    [provider]  metFORMIN (GLUCOPHAGE-XR) 750 MG 24 hr tablet Take 1,000 mg by mouth daily with supper.    [provider]  Multiple Vitamin (MULTIVITAMIN) tablet Take 1 tablet by mouth daily.    [provider]  omeprazole (PRILOSEC) 20 MG capsule Take 20 mg by mouth daily.    [provider]  saccharomyces boulardii (FLORASTOR) 250 MG capsule Take 250 mg by mouth every morning.    [provider]    Allergies Penicillins  Family History  Problem Relation Age of Onset  . Diabetes Father   . Diabetes Paternal Uncle     Social History Social History   Tobacco Use  . Smoking status: Never Smoker  . Smokeless tobacco: Never Used  Substance Use Topics  . Alcohol use: No    Alcohol/week: 0.0 standard drinks  . Drug use: No      Review of Systems Constitutional: No fever/chills, lightheadedness Eyes: No visual changes. ENT: No sore throat. Cardiovascular: No chest pain Respiratory: Positive for SOB Gastrointestinal: No abdominal pain.  No nausea, no vomiting.  No diarrhea.  No constipation. Genitourinary: Negative for dysuria. Musculoskeletal: Negative for back pain. Skin: Negative for rash. Neurological: Negative for headaches, focal weakness or numbness. All  other ROS negative ____________________________________________   PHYSICAL EXAM:  VITAL SIGNS: ED Triage Vitals  Enc Vitals Group     BP 11/08/2019 0526 136/85     Pulse Rate 11/26/2019 0526 (!) 102     Resp 12/04/2019 0526 17     Temp --      Temp src --      SpO2 11/18/2019 0526 (!) 85 %     Weight 11/18/2019 0522 130 lb (59 kg)     Height 11/23/2019 0522 5\' 8"  (1.727 m)     Head Circumference --      Peak Flow --      Pain Score --       Pain Loc --      Pain Edu? --      Excl. in GC? --     Constitutional: Alert and oriented.  Eyes: Conjunctivae are normal. EOMI. Head: Atraumatic. Nose: No congestion/rhinnorhea. Mouth/Throat: Mucous membranes are moist.   Neck: No stridor. Trachea Midline. FROM Cardiovascular: Normal rate, regular rhythm. Grossly normal heart sounds.  Good peripheral circulation. Respiratory: No wheezing noted, on a nonrebreather Gastrointestinal: Soft and nontender. No distention. No abdominal bruits.  Musculoskeletal: No lower extremity tenderness nor edema.  No joint effusions. Neurologic:  Normal speech and language. No gross focal neurologic deficits are appreciated.  Skin:  Skin is warm, dry and intact. No rash noted. Psychiatric: Mood and affect are normal. Speech and behavior are normal. GU: Deferred   ____________________________________________   LABS (all labs ordered are listed, but only abnormal results are displayed)  Labs Reviewed  BASIC METABOLIC PANEL - Abnormal; Notable for the following components:      Result Value   Potassium 5.3 (*)    Glucose, Bld 237 (*)    BUN 29 (*)    Creatinine, Ser 1.62 (*)    Calcium 8.5 (*)    GFR calc non Af Amer 45 (*)    GFR calc Af Amer 52 (*)    All other components within normal limits  CBC - Abnormal; Notable for the following components:   RBC 3.68 (*)    Hemoglobin 10.8 (*)    HCT 33.1 (*)    All other components within normal limits  HEPATIC FUNCTION PANEL - Abnormal; Notable for the following components:   Total Protein 6.3 (*)    Albumin 3.3 (*)    All other components within normal limits  BRAIN NATRIURETIC PEPTIDE - Abnormal; Notable for the following components:   B Natriuretic Peptide 718.3 (*)    All other components within normal limits  LACTIC ACID, PLASMA - Abnormal; Notable for the following components:   Lactic Acid, Venous 2.2 (*)    All other components within normal limits  TROPONIN I (HIGH SENSITIVITY) -  Abnormal; Notable for the following components:   Troponin I (High Sensitivity) 151 (*)    All other components within normal limits  SARS CORONAVIRUS 2 BY RT PCR (HOSPITAL ORDER, PERFORMED IN [facility])  CULTURE, BLOOD (ROUTINE X 2)  CULTURE, BLOOD (ROUTINE X 2)  PROCALCITONIN  LACTIC ACID, PLASMA   ____________________________________________   ED ECG REPORT I, , the attending physician, personally viewed and interpreted this ECG.  Sinus tachycardia rate of 103, no ST elevations, T wave inversions in V5 and V6 with occasional PVC ____________________________________________  RADIOLOGY Concha Se, personally viewed and evaluated these images (plain radiographs) as part of my medical decision making, as well as reviewing the written  report by the radiologist.  ED MD interpretation: Concern for pulmonary edema.  Official radiology report(s): DG Chest Portable 1 View  Result Date: 2019-12-21 CLINICAL DATA:  Shortness of breath. EXAM: PORTABLE CHEST 1 VIEW COMPARISON:  None. FINDINGS: The heart is mildly enlarged. The mediastinal and hilar contours are grossly normal. Increased interstitial markings and Kerley B lines consistent with interstitial pulmonary edema. Small left pleural effusion and bibasilar atelectasis. The bony thorax is intact. IMPRESSION: CHF with small left effusion and bibasilar atelectasis. Electronically Signed   By: Rudie Meyer M.D.   On: 12/21/2019 05:46    ____________________________________________   PROCEDURES  Procedure(s) performed (including Critical Care):  .Critical Care Performed by: Concha Se, MD Authorized by: Concha Se, MD   Critical care provider statement:    Critical care time (minutes):  45   Critical care was necessary to treat or prevent imminent or life-threatening deterioration of the following conditions:  Respiratory failure   Critical care was time spent personally by me on the following  activities:  Discussions with consultants, evaluation of patient's response to treatment, examination of patient, ordering and performing treatments and interventions, ordering and review of laboratory studies, ordering and review of radiographic studies, pulse oximetry, re-evaluation of patient's condition, obtaining history from patient or surrogate and review of old charts     ____________________________________________   INITIAL IMPRESSION / ASSESSMENT AND PLAN / ED COURSE   Shakeim Batt was evaluated in Emergency Department on 12-21-2019 for the symptoms described in the history of present illness. He was evaluated in the context of the global COVID-19 pandemic, which necessitated consideration that the patient might be at risk for infection with the SARS-CoV-2 virus that causes COVID-19. Institutional protocols and algorithms that pertain to the evaluation of patients at risk for COVID-19 are in a state of rapid change based on information released by regulatory bodies including the CDC and federal and state organizations. These policies and algorithms were followed during the patient's care in the ED.     Pt presents with SOB. Differential includes: Attempted to put patient on 6 L of oxygen he desatted down to 88.  Patient placed back on nonrebreather and his oxygen is continue to go down into the 70s.  Respiratory was called to place patient on high flow nasal cannula.  PNA-will get xray to evaluation Anemia-CBC to evaluate ACS- will get trops Arrhythmia-Will get EKG and keep on monitor.  COVID- will get testing per algorithm. PE-lower suspicion given no risk factors and other cause more likely  Chest x-ray with bilateral opacifications concerning for pulmonary edema.  Patient doesn't really having leg swelling.  Not convinced that this is secondary to pulmonary edema at this time.  I think we should get a CT PE to make sure no evidence of pulmonary hemorrhage, PE, infarct,  infection.  Patient is currently on 9 L of oxygen but his Covid swab was negative.  There was some report of patient having some specks on his x-ray at the urgent care and having some night sweats.  No history of TB.  At this time I think that is all lower on my differential.  Troponin is elevated, BNP are elevated.  This could be demand from possible PE.  We will hold off on Lasix/heparin until we get some better imaging with a CT scan.  At this time he does not meet SIRS criteria given he only had a slightly elevated heart rate.  His lactate was slightly elevated but this  again could be secondary to fluid overload and I do not want to fluid resuscitate him at this time.  Discussed the hospital team for admission given his significant hypoxia pending CT scan  ____________________________________________   FINAL CLINICAL IMPRESSION(S) / ED DIAGNOSES   Final diagnoses:  Acute respiratory failure with hypoxia (HCC)     MEDICATIONS GIVEN DURING THIS VISIT:  Medications - No data to display   ED Discharge Orders    None       Note:  This document was prepared using Dragon voice recognition software and may include unintentional dictation errors.   Concha Se, MD 11/14/2019 [phone]

## 2019-12-05 NOTE — ED Notes (Signed)
Pt placed on 6L O2 [place], O2 saturation 100%, breathing even and unlabored. O2 decreased to 4L [place] at this time

## 2019-12-05 NOTE — Consult Note (Signed)
ANTICOAGULATION CONSULT NOTE - Follow Up Consult  Pharmacy Consult for Heparin Indication: chest pain/ACS  Allergies  Allergen Reactions  . Penicillins Rash    Did it involve swelling of the face/tongue/throat, SOB, or low BP? No Did it involve sudden or severe rash/hives, skin peeling, or any reaction on the inside of your mouth or nose? Yes Did you need to seek medical attention at a hospital or doctor's office? No When did it last happen? If all above answers are "NO", may proceed with cephalosporin use.    Patient Measurements: Height: 5\' 8"  (172.7 cm) Weight: 59 kg (130 lb) IBW/kg (Calculated) : 68.4 Heparin Dosing Weight: 59 kg  Vital Signs: Temp: 97.9 F (36.6 C) (06/28 0533) Temp Source: Oral (06/28 0533) BP: 102/78 (06/28 0731) Pulse Rate: 108 (06/28 0731)  Labs: Recent Labs    2019/12/11 0525 12-11-2019 0728  HGB 10.8*  --   HCT 33.1*  --   PLT 268  --   CREATININE 1.62*  --   TROPONINIHS 151* 144*    Estimated Creatinine Clearance: 39.5 mL/min (A) (by C-G formula based on SCr of 1.62 mg/dL (H)).   Medications:  (Not in a hospital admission)  Scheduled:  . furosemide  60 mg Intravenous Once   Infusions:  . azithromycin    . cefTRIAXone (ROCEPHIN)  IV     PRN:   Assessment: Pharmacy consulted to start heparin for ACS. No DOAC noted PTA.   Goal of Therapy:  Heparin level 0.3-0.7 units/ml Monitor platelets by anticoagulation protocol: Yes   Plan:  Give 3500 units bolus x 1 Start heparin infusion at 700 units/hr Check anti-Xa level in 6 hours and daily while on heparin Continue to monitor H&H and platelets  12/07/19, PharmD, BCPS 2019/12/11,8:41 AM

## 2019-12-05 NOTE — ED Notes (Signed)
Admitting MD at bedside at this time.

## 2019-12-05 NOTE — H&P (Signed)
History and Physical    Mylz Yuan ZSW:[id number] DOB: [birth year] DOA: 11/20/2019  PCP: Kandyce Rud, MD   Patient coming from: Home  I have personally briefly reviewed patient's old medical records in [facility]  Chief Complaint: Shortness of breath  HPI: Robb Sibal is a 64 y.o. male with medical history significant for diabetes mellitus and hypertension who presents to the emergency room for evaluation of shortness of breath.  Patient states that he was doing well up until 2 days ago [place] and developed sudden onset shortness of breath which has progressively worsened over the last 2 days.  Shortness of breath is associated with a cough productive of dark brown phlegm as well as a cough with cold sweats. Patient was brought into the ER by EMS and had pulse oximetry of 70% on room air.  He was placed on 4 - 6L of oxygen with improvement in his pulse oximetry to the 80s.  Patient was then placed on a nonrebreather mask with pulse oximetry of 96%.  He is currently on high flow nasal cannula 9 L. Patient was seen in early June for possible laryngitis and was treated with antibiotics with improvement in his symptoms.  He was seen again 2 weeks ago at the urgent care and started on antibiotics for possible pneumonia. Patient did not receive his COVID-19 vaccination and his SARS Coronavirus 2 test is negative. Chest x-ray shows CHF with small left effusion and bibasilar atelectasis. CT angiogram of the chest was negative for PE but shows moderate free-flowing pleural effusions bilaterally with multifocal airspace consolidation, most severe in the lower lobe regions bilaterally. Suspect multifocal pneumonia.  Multiple nodular opacities throughout the aerated portions of the lung ranging in size from 2-6 mm. Concern for potential neoplastic etiology given these multiple noncalcified nodular lesions. Labs reveal elevated troponin levels as  well as lactic acid of 2.2.  White count is normal and serum creatinine is 1.62 with potassium of 5.3. Baseline serum creatinine is 1.3   ED Course: Patient is a 64 year old Caucasian male who presents to the emergency room via EMS for evaluation of respiratory distress.  He was hypoxic in the field with pulse oximetry of 70% on room air and is currently on high flow nasal cannula at 9L.  CT angiogram shows bilateral pleural effusions and multifocal pneumonia.  Negative for PE. Patient received a dose of Rocephin and Zithromax in the ER as well as IV Lasix and will be admitted to the hospital for further evaluation.  Review of Systems: As per HPI otherwise 10 point review of systems negative.    Past Medical History:  Diagnosis Date  . Diabetes mellitus without complication (HCC)   . Hypertension     Past Surgical History:  Procedure Laterality Date  . ADENOIDECTOMY    . COLONOSCOPY WITH PROPOFOL N/A 02/18/2016   Procedure: COLONOSCOPY WITH PROPOFOL;  Surgeon: Christena Deem, MD;  Location: [facility] ENDOSCOPY;  Service: Endoscopy;  Laterality: N/A;  . ESOPHAGOGASTRODUODENOSCOPY (EGD) WITH PROPOFOL N/A 02/18/2016   Procedure: ESOPHAGOGASTRODUODENOSCOPY (EGD) WITH PROPOFOL;  Surgeon: Christena Deem, MD;  Location: [facility] ENDOSCOPY;  Service: Endoscopy;  Laterality: N/A;  . Surgery for broken nose       reports that he has never smoked. He has never used smokeless tobacco. He reports that he does not drink alcohol and does not use drugs.  Allergies  Allergen Reactions  . Penicillins Rash    Did  it involve swelling of the face/tongue/throat, SOB, or low BP? No Did it involve sudden or severe rash/hives, skin peeling, or any reaction on the inside of your mouth or nose? Yes Did you need to seek medical attention at a hospital or doctor's office? No When did it last happen? If all above answers are "NO", may proceed with cephalosporin use.    Family History  Problem Relation Age  of Onset  . Diabetes Father   . Diabetes Paternal Uncle      Prior to Admission medications   Medication Sig Start Date End Date Taking? Authorizing Provider  benzonatate (TESSALON) 100 MG capsule Take 100 mg by mouth 3 (three) times daily as needed. 12/03/19   [provider]  brompheniramine-pseudoephedrine-DM 30-2-10 MG/5ML syrup Take 10 mLs by mouth every 4 (four) hours as needed. 12/03/19   [provider]  doxycycline (VIBRAMYCIN) 100 MG capsule Take 100 mg by mouth 2 (two) times daily. 12/03/19   [provider]  fluticasone (FLONASE) 50 MCG/ACT nasal spray Place 2 sprays into both nostrils daily. 11/11/19   [provider]  glipiZIDE (GLUCOTROL) 5 MG tablet Take 5 mg by mouth daily before breakfast.    [provider]  metFORMIN (GLUCOPHAGE) 500 MG tablet Take 1,000 mg by mouth daily with supper.    [provider]  Multiple Vitamin (MULTIVITAMIN) tablet Take 1 tablet by mouth daily.    [provider]  omeprazole (PRILOSEC) 20 MG capsule Take 20 mg by mouth daily.    [provider]  saccharomyces boulardii (FLORASTOR) 250 MG capsule Take 250 mg by mouth every morning.    [provider]    Physical Exam: Vitals:   11/27/2019 0600 11/24/2019 0630 11/17/2019 0700 11/13/2019 0731  BP: (!) 136/91 130/83 90/69 102/78  Pulse: (!) 104 (!) 102 (!) 104 (!) 108  Resp: (!) 21 (!) 23 (!) 23 (!) 22  Temp:      TempSrc:      SpO2: 97% 96% 95% 91%  Weight:      Height:         Vitals:   11/15/2019 0600 12/07/2019 0630 12/01/2019 0700 11/17/2019 0731  BP: (!) 136/91 130/83 90/69 102/78  Pulse: (!) 104 (!) 102 (!) 104 (!) 108  Resp: (!) 21 (!) 23 (!) 23 (!) 22  Temp:      TempSrc:      SpO2: 97% 96% 95% 91%  Weight:      Height:        Constitutional: NAD, alert and oriented x 3.  Acutely ill-appearing Eyes: PERRL, lids and conjunctivae normal ENMT: Mucous membranes are moist.  Neck: normal, supple, no masses, no  thyromegaly Respiratory: Scattered rhonchi bilaterally,  Crackles at the bases. Normal respiratory effort. No accessory muscle use.  Cardiovascular: Tachycardic, no murmurs / rubs / gallops. No extremity edema. 2+ pedal pulses. No carotid bruits.  Abdomen: no tenderness, no masses palpated. No hepatosplenomegaly. Bowel sounds positive.  Musculoskeletal: no clubbing / cyanosis. No joint deformity upper and lower extremities.  Skin: no rashes, lesions, ulcers.  Neurologic: No gross focal neurologic deficit. Psychiatric: Normal mood and affect.   Labs on Admission: I have personally reviewed following labs and imaging studies  CBC: Recent Labs  Lab 11/29/2019 0525  WBC 8.4  HGB 10.8*  HCT 33.1*  MCV 89.9  PLT 409   Basic Metabolic Panel: Recent Labs  Lab 12/02/2019 0525  NA 136  K 5.3*  CL 98  CO2 26  GLUCOSE 237*  BUN 29*  CREATININE 1.62*  CALCIUM 8.5*   GFR: Estimated Creatinine Clearance: 39.5 mL/min (A) (by C-G formula based on SCr of 1.62 mg/dL (H)). Liver Function Tests: Recent Labs  Lab 11/28/2019 0525  AST 27  ALT 26  ALKPHOS 85  BILITOT 1.1  PROT 6.3*  ALBUMIN 3.3*   No results for input(s): LIPASE, AMYLASE in the last 168 hours. No results for input(s): AMMONIA in the last 168 hours. Coagulation Profile: No results for input(s): INR, PROTIME in the last 168 hours. Cardiac Enzymes: No results for input(s): CKTOTAL, CKMB, CKMBINDEX, TROPONINI in the last 168 hours. BNP (last 3 results) No results for input(s): PROBNP in the last 8760 hours. HbA1C: No results for input(s): HGBA1C in the last 72 hours. CBG: No results for input(s): GLUCAP in the last 168 hours. Lipid Profile: No results for input(s): CHOL, HDL, LDLCALC, TRIG, CHOLHDL, LDLDIRECT in the last 72 hours. Thyroid Function Tests: No results for input(s): TSH, T4TOTAL, FREET4, T3FREE, THYROIDAB in the last 72 hours. Anemia Panel: No results for input(s): VITAMINB12, FOLATE, FERRITIN, TIBC, IRON,  RETICCTPCT in the last 72 hours. Urine analysis: No results found for: COLORURINE, APPEARANCEUR, LABSPEC, PHURINE, GLUCOSEU, HGBUR, BILIRUBINUR, KETONESUR, PROTEINUR, UROBILINOGEN, NITRITE, LEUKOCYTESUR  Radiological Exams on Admission: CT Angio Chest PE W and/or Wo Contrast  Result Date: 11/26/2019 CLINICAL DATA:  Shortness of breath decreased oxygen saturation EXAM: CT ANGIOGRAPHY CHEST WITH CONTRAST TECHNIQUE: Multidetector CT imaging of the chest was performed using the standard protocol during bolus administration of intravenous contrast. Multiplanar CT image reconstructions and MIPs were obtained to evaluate the vascular anatomy. CONTRAST:  72mL OMNIPAQUE IOHEXOL 350 MG/ML SOLN COMPARISON:  Chest radiograph December 05, 2019 FINDINGS: Cardiovascular: There is no demonstrable pulmonary embolus. There is no thoracic aortic aneurysm. No dissection is evident; note that the contrast bolus in the aorta is not sufficient for potential dissection assessment. There are scattered foci calcification in the visualized great vessels. There are foci aortic atherosclerosis. There are multiple foci of coronary artery calcification. There is no appreciable pericardial thickening. Minimal pericardial effusion may be within physiologic range. Mediastinum/Nodes: Wound visualized thyroid appears unremarkable. There are scattered subcentimeter mediastinal lymph nodes. There is a subcarinal lymph node measuring 1.0 x 1.0 cm. No esophageal lesions are appreciable. Lungs/Pleura: There are moderate free-flowing pleural effusions bilaterally. There is airspace consolidation in each lower lobe. There is airspace opacity in the right upper lobe centrally. There is airspace opacity in the posterior segment of the left upper lobe as well. There are multiple nodular opacities throughout the portions of lung which are not involved with consolidation or effusion. Nodular opacities range in size from as small as 2 mm to as large as 6 mm.  These nodular opacities are seen diffusely throughout the upper lobes CT may well be present in other areas which currently cannot be assessed due to pleural effusion and consolidation. Upper Abdomen: There is fatty infiltration near the fissure for the ligamentum teres. There is an apparent adenoma in the left adrenal measuring 1.0 x 0.7 cm. Visualized upper abdominal structures otherwise appear unremarkable. Musculoskeletal: No blastic or lytic bone lesions. No chest wall lesions evident. Review of the MIP images confirms the above findings. IMPRESSION: 1. No demonstrable pulmonary embolus. No thoracic aortic aneurysm. No dissection evident with note that the contrast bolus is not sufficient for dissection assessment. There is aortic atherosclerosis as well as multiple foci of coronary artery calcification and scattered foci of great vessel calcification. 2. Moderate  free-flowing pleural effusions bilaterally with multifocal airspace consolidation, most severe in the lower lobe regions bilaterally. Suspect multifocal pneumonia. 3. Multiple nodular opacities throughout the aerated portions of the lung ranging in size from 2-6 mm. Concern for potential neoplastic etiology given these multiple noncalcified nodular lesions. 4. Borderline prominent subcarinal lymph node. Other subcentimeter lymph nodes noted throughout the mediastinum. 5.  Benign left adrenal adenoma. Aortic Atherosclerosis (ICD10-I70.0). Electronically Signed   By: Bretta Bang III M.D.   On: 12/06/2019 08:13   DG Chest Portable 1 View  Result Date: 11/19/2019 CLINICAL DATA:  Shortness of breath. EXAM: PORTABLE CHEST 1 VIEW COMPARISON:  None. FINDINGS: The heart is mildly enlarged. The mediastinal and hilar contours are grossly normal. Increased interstitial markings and Kerley B lines consistent with interstitial pulmonary edema. Small left pleural effusion and bibasilar atelectasis. The bony thorax is intact. IMPRESSION: CHF with small left  effusion and bibasilar atelectasis. Electronically Signed   By: Rudie Meyer M.D.   On: 11/23/2019 05:46    EKG: Independently reviewed.  Sinus rhythm Sinus pause ST depression in the lateral leads  Assessment/Plan Principal Problem:   Respiratory failure with hypoxia (HCC) Active Problems:   Diabetes mellitus without complication (HCC)   Elevated troponin   CKD (chronic kidney disease), stage III   Hyperkalemia   Acute CHF (congestive heart failure) (HCC)     Acute respiratory failure with hypoxia Multifactorial most likely secondary to acute congestive heart failure of unclear etiology as well as multifocal pneumonia Patient has increased work of breathing and is tachypneic, room air pulse oximetry was 70% in the field and he is currently on HFNC at 9L Arterial blood gas is pending Continue oxygen supplementation to maintain pulse oximetry greater than 92% We will attempt to wean patient off oxygen once his acute illness improves   Acute CHF Unclear etiology Rule out ischemia as the cause of his congestive heart failure this patient has elevated troponin level Will obtain 2D echocardiogram to assess for wall motion abnormality Start patient on IV Lasix We will request cardiology consult   Elevated troponin ??  Rule out ACS versus secondary to demand ischemia from respiratory failure We will trend troponin levels Continue aspirin and heparin Will consult cardiology   Diabetes mellitus with complications of chronic kidney disease stage III Maintain consistent carbohydrate diet Sliding scale coverage with insulin Patient has a baseline serum creatinine of 1.3 but today on admission it is 1.6 with mild hyperkalemia Expect improvement in patient's potassium levels following diuretic therapy    Multifocal pneumonia Patient is afebrile, has a normal white count and a normal procalcitonin level Was recently treated with antibiotics We will place patient empirically on  Rocephin and Zithromax   DVT prophylaxis: Heparin Code Status: Full code Family Communication: Greater than 50% of time was spent discussing plan of care with patient at the bedside.  All questions and concerns have been addressed.  He verbalizes understanding and agrees with the plan. Disposition Plan: Back to previous home environment Consults called: Cardiology     Shayla Heming MD [facility]     11/22/2019, 9:12 AM

## 2019-12-05 NOTE — ED Notes (Signed)
EDP made aware patient had 4 beat run of NSVT. Copy obtained and placed in the chart by this RN.

## 2019-12-05 NOTE — ED Notes (Signed)
EDP made aware of patient's increased O2 demand. Pt O2 sat 91% on 12L via HFNC. Per EDP continue to monitor. Pt repositioned to sitting position, states feels better at this time.

## 2019-12-05 NOTE — ED Notes (Signed)
Pt's wife updated on pt's status

## 2019-12-05 NOTE — ED Notes (Addendum)
Lurena Joiner RN at bedside attempting to collect 2nd set of blood cultures

## 2019-12-05 NOTE — ED Notes (Signed)
Pt has order for lasix infusion, clarified with Dr. Joylene Igo and she states the patient does not need a lasix infusion. Verbal order given by Dr. Joylene Igo for Lasix 40mg  BID IV

## 2019-12-05 NOTE — ED Notes (Signed)
Dr. Joylene Igo informed of critical trop

## 2019-12-05 NOTE — ED Notes (Signed)
Cardiology PA at bedside at this time.  °

## 2019-12-05 NOTE — ED Notes (Signed)
Pt appears more calm while on Bipap, pt denies any needs, wife remains at bedside at this time. No urine output noted at this time. Call bell remains within reach at this time.

## 2019-12-05 NOTE — ED Notes (Signed)
Attempted to call report to 2A, per secretary the room that pt is going to has to be cleaned by EVS and this RN could try calling in 30 mins to check status of room.

## 2019-12-05 NOTE — ED Notes (Signed)
This RN to bedside, introduced self to patient. Repeat lactic and trop collected by this RN. Pt c/o feeling flushed and hot, AC adjusted by this RN, pt provided with cool wet wash cloth for comfort. Pt states feels better. Call bell within reach. Pt denies further needs. Pt repositioned in bed at this time.

## 2019-12-05 NOTE — ED Notes (Signed)
Pt and wife given warm blanket

## 2019-12-05 NOTE — ED Provider Notes (Addendum)
Patient remains with significant respiratory failure with hypoxia.  His procalcitonin is negative.  Given no white count or fever seems more likely cardiac in nature particular given elevated troponins.  Always any evidence of PE on my read of the CT but does show large bilateral effusions with compressive atelectasis.  I am going to give Lasix.  Will heparinize for elevated trop.  However troponin elevation as well as lactic acidosis most likely secondary to respiratory failure and demand ischemia.  Patient to be admitted to the hospital.   Willy Eddy, MD 11/27/2019 [phone]  Patient reportedly got up to go use the restroom and started feeling worsening shortness of breath.  His troponin is downtrending.  Still satting the low 90s.  Appears clinically unwell now.  I have asked respiratory to place patient on BiPAP as this be suspect that he is starting to tire out.  He was placed on nonrebreather mask with 100% saturations and improvement in symptoms.  Admitting notified of change in status.     Willy Eddy, MD 11/08/2019 1005

## 2019-12-05 NOTE — ED Notes (Signed)
Admitting MD made aware per Dr. Roxan Hockey pt to be placed on Bipap at this time.

## 2019-12-05 NOTE — ED Notes (Signed)
RT notifed of new order for ABG.

## 2019-12-05 NOTE — ED Notes (Signed)
ECHO at bedside.

## 2019-12-05 NOTE — ED Triage Notes (Signed)
Patient coming [facility] from home for respiratory distress. Patient's oxygen saturation 70% on RA at EMS arrival. Patient's oxygen saturation increased to 80s on [place] 4-6L. Patient's oxygen saturation increased to 96% on non-rebreather. Rales lower right lung on auscultation, diminished lower left lung. Patient denies chest pain.  Patient seen at urgent care for the same and started on antibiotics; patient denies relief of symptoms.

## 2019-12-05 NOTE — ED Notes (Signed)
Pt requested to use the toilet to have a BM. Advised pt it would be easier to use bed pan due to being on bipap and heparin, pt states he would like to try and walk to the toilet. Pt assisted to toilet while wearing bipap. Gait steady. No SHOB noted during ambulation or while on toilet. Pt O2 between 98%-100% during the time he was out of bed.

## 2019-12-05 NOTE — ED Notes (Signed)
Pt c/o sudden onset SOB, EDP Robinson to bedside, per EDP Roxan Hockey, RT called to bedside to place patient on BiPap at this time. Pt currently on NRB at this time with O2 sats 100%.

## 2019-12-05 NOTE — Consult Note (Signed)
CARDIOLOGY CONSULT NOTE               Patient ID: Terrance Watson MRN: [id number] DOB/AGE: 07-30-1955 64 y.o.  Admit date: 11/24/2019 Referring Physician [place] Primary Physician [facility] Primary Cardiologist none per patient Reason for Consultation elevated troponin, acute hypoxic respiratory failure  HPI: 64 year old gentleman referred for evaluation of elevated troponin and acute hypoxic respiratory failure, concerning for CHF.  The patient has a history of type 2 diabetes and hyperlipidemia.  At the beginning of this month, the patient was evaluated at the walk-in clinic and treated with antibiotics for laryngitis and bronchitis, which improved, though with persistent cough, and with progressive shortness of breath that started about 2 weeks ago.  The patient on vacation in the mountains in [place], [place] last week and experienced exertional shortness of breath requiring that he stop to rest while taking walks, as well as generalized fatigue.  The patient's wife reports that after walking a short distance, the patient "collapsed" next to the car due to weakness.  The patient and wife suspected that his symptoms were secondary to dehydration, hypoglycemia, and being more active than usual as he is usually quite sedentary. The patient denies experiencing any chest pain or lower extremity edema. The patient's wife thinks that his abdomen has been distended and tighter. The patient has been having difficulty lying down at night, and has been sitting up to breath better. EMS was called early this morning as the patient felt like he was going to pass out, though did not lose consciousness. Upon EMS arrival, the patient's oxygen saturation was in the 70s on room air. He was placed on high-flow [place] in the ER. Admission labs notable for high sensitivity troponin 151 and 144, lactic acid 2.2, negative COVID, BNP 718, normal WBC, hemoglobin 10.8, hematocrit 33.1. Chest CT negative for PE,  with moderate free-flowing pleural effusions bilaterally with multifocal airspace consolidation, most severe in the lower lobe bilaterally, likely representing multifocal pneumonia, multiple nodular opacities, concerning for potential neoplastic etiology, and borderline prominent subcarinal lymph node. ECG revealed sinus tachycardia at a rate of 103 bpm with PVC and T wave inversions in V5, V6. The patient was started on heparin drip and IV Lasix. At this time, he reports that he "not going too good," reporting shortness of breath, without chest pain. The patient denies a history of MI, stroke, coronary stents, PE/DVT, or arrhythmias. He states he has never had a similar episode.  Review of systems complete and found to be negative unless listed above     Past Medical History:  Diagnosis Date  . Diabetes mellitus without complication (HCC)   . Hypertension     Past Surgical History:  Procedure Laterality Date  . ADENOIDECTOMY    . COLONOSCOPY WITH PROPOFOL N/A 02/18/2016   Procedure: COLONOSCOPY WITH PROPOFOL;  Surgeon: Christena Deem, MD;  Location: [facility] ENDOSCOPY;  Service: Endoscopy;  Laterality: N/A;  . ESOPHAGOGASTRODUODENOSCOPY (EGD) WITH PROPOFOL N/A 02/18/2016   Procedure: ESOPHAGOGASTRODUODENOSCOPY (EGD) WITH PROPOFOL;  Surgeon: Christena Deem, MD;  Location[facility] ENDOSCOPY;  Service: Endoscopy;  Laterality: N/A;  . Surgery for broken nose      (Not in a hospital admission)  Social History   Socioeconomic History  . Marital status: Married    Spouse name: Not on file  . Number of children: Not on file  . Years of education: Not on file  . Highest education level: Not on file  Occupational History  . Not on  file  Tobacco Use  . Smoking status: Never Smoker  . Smokeless tobacco: Never Used  Substance and Sexual Activity  . Alcohol use: No    Alcohol/week: 0.0 standard drinks  . Drug use: No  . Sexual activity: Not on file  Other Topics Concern  . Not on file    Social History Narrative  . Not on file   Social Determinants of Health   Financial Resource Strain:   . Difficulty of Paying Living Expenses:   Food Insecurity:   . Worried About Charity fundraiser in the Last Year:   . Arboriculturist in the Last Year:   Transportation Needs:   . Film/video editor (Medical):   Marland Kitchen Lack of Transportation (Non-Medical):   Physical Activity:   . Days of Exercise per Week:   . Minutes of Exercise per Session:   Stress:   . Feeling of Stress :   Social Connections:   . Frequency of Communication with Friends and Family:   . Frequency of Social Gatherings with Friends and Family:   . Attends Religious Services:   . Active Member of Clubs or Organizations:   . Attends Archivist Meetings:   Marland Kitchen Marital Status:   Intimate Partner Violence:   . Fear of Current or Ex-Partner:   . Emotionally Abused:   Marland Kitchen Physically Abused:   . Sexually Abused:     Family History  Problem Relation Age of Onset  . Diabetes Father   . Diabetes Paternal Uncle       Review of systems complete and found to be negative unless listed above      PHYSICAL EXAM  General: Ill-appearing, sitting up in bed, alert and oriented, in mild respiratory distress HEENT:  Normocephalic and atramatic Neck:  No JVD.  Lungs: increased effort of breathing on high flow [place], accessory respiratory muscle use, unable to speak in full sentences, decreased bibasilar breath sounds, inspiratory wheezing Heart: Tachycardic, regular rhythm . Normal S1 and S2 without gallops or murmurs.  Abdomen: distended, tight Msk:  Back normal, gait not assessed. No obvious deformities Extremities: No clubbing, cyanosis or edema.   Neuro: Alert and oriented X 3. Psych:  Good affect, responds appropriately  Labs:   Lab Results  Component Value Date   WBC 8.4 28-Dec-2019   HGB 10.8 (L) 12-28-2019   HCT 33.1 (L) Dec 28, 2019   MCV 89.9 12/28/2019   PLT 268 December 28, 2019    Recent Labs   Lab December 28, 2019 0525  NA 136  K 5.3*  CL 98  CO2 26  BUN 29*  CREATININE 1.62*  CALCIUM 8.5*  PROT 6.3*  BILITOT 1.1  ALKPHOS 85  ALT 26  AST 27  GLUCOSE 237*   No results found for: CKTOTAL, CKMB, CKMBINDEX, TROPONINI No results found for: CHOL No results found for: HDL No results found for: LDLCALC No results found for: TRIG No results found for: CHOLHDL No results found for: LDLDIRECT    Radiology: CT Angio Chest PE W and/or Wo Contrast  Result Date: 28-Dec-2019 CLINICAL DATA:  Shortness of breath decreased oxygen saturation EXAM: CT ANGIOGRAPHY CHEST WITH CONTRAST TECHNIQUE: Multidetector CT imaging of the chest was performed using the standard protocol during bolus administration of intravenous contrast. Multiplanar CT image reconstructions and MIPs were obtained to evaluate the vascular anatomy. CONTRAST:  53mL OMNIPAQUE IOHEXOL 350 MG/ML SOLN COMPARISON:  Chest radiograph 2019-12-28 FINDINGS: Cardiovascular: There is no demonstrable pulmonary embolus. There is no thoracic  aortic aneurysm. No dissection is evident; note that the contrast bolus in the aorta is not sufficient for potential dissection assessment. There are scattered foci calcification in the visualized great vessels. There are foci aortic atherosclerosis. There are multiple foci of coronary artery calcification. There is no appreciable pericardial thickening. Minimal pericardial effusion may be within physiologic range. Mediastinum/Nodes: Wound visualized thyroid appears unremarkable. There are scattered subcentimeter mediastinal lymph nodes. There is a subcarinal lymph node measuring 1.0 x 1.0 cm. No esophageal lesions are appreciable. Lungs/Pleura: There are moderate free-flowing pleural effusions bilaterally. There is airspace consolidation in each lower lobe. There is airspace opacity in the right upper lobe centrally. There is airspace opacity in the posterior segment of the left upper lobe as well. There are  multiple nodular opacities throughout the portions of lung which are not involved with consolidation or effusion. Nodular opacities range in size from as small as 2 mm to as large as 6 mm. These nodular opacities are seen diffusely throughout the upper lobes CT may well be present in other areas which currently cannot be assessed due to pleural effusion and consolidation. Upper Abdomen: There is fatty infiltration near the fissure for the ligamentum teres. There is an apparent adenoma in the left adrenal measuring 1.0 x 0.7 cm. Visualized upper abdominal structures otherwise appear unremarkable. Musculoskeletal: No blastic or lytic bone lesions. No chest wall lesions evident. Review of the MIP images confirms the above findings. IMPRESSION: 1. No demonstrable pulmonary embolus. No thoracic aortic aneurysm. No dissection evident with note that the contrast bolus is not sufficient for dissection assessment. There is aortic atherosclerosis as well as multiple foci of coronary artery calcification and scattered foci of great vessel calcification. 2. Moderate free-flowing pleural effusions bilaterally with multifocal airspace consolidation, most severe in the lower lobe regions bilaterally. Suspect multifocal pneumonia. 3. Multiple nodular opacities throughout the aerated portions of the lung ranging in size from 2-6 mm. Concern for potential neoplastic etiology given these multiple noncalcified nodular lesions. 4. Borderline prominent subcarinal lymph node. Other subcentimeter lymph nodes noted throughout the mediastinum. 5.  Benign left adrenal adenoma. Aortic Atherosclerosis (ICD10-I70.0). Electronically Signed   By: Bretta Bang III M.D.   On: 11/12/2019 08:13   DG Chest Portable 1 View  Result Date: 11/19/2019 CLINICAL DATA:  Shortness of breath. EXAM: PORTABLE CHEST 1 VIEW COMPARISON:  None. FINDINGS: The heart is mildly enlarged. The mediastinal and hilar contours are grossly normal. Increased  interstitial markings and Kerley B lines consistent with interstitial pulmonary edema. Small left pleural effusion and bibasilar atelectasis. The bony thorax is intact. IMPRESSION: CHF with small left effusion and bibasilar atelectasis. Electronically Signed   By: Rudie Meyer M.D.   On: 11/15/2019 05:46    EKG: Sinus tachycardia, occasional PVCs, T wave inversions V5, V6, without ST elevation  ASSESSMENT AND PLAN:  1. Acute hypoxic respiratory failure, likely secondary to multifocal pneumonia. Chest CT negative for PE, with moderate free-flowing pleural effusions bilaterally with multifocal airspace consolidation, most severe in the lower lobe bilaterally, suspect multifocal pneumonia. BNP elevated to 718. Lactic acid 2.2. 2. Elevated troponin, (155, 144) likely secondary to demand supply ischemia in the setting of acute hypoxic respiratory failure. ECG with T wave inversions V5, V6 without ST elevation, without prior ECG for comparison. Patient denies chest pain.  Recommendations: 1. Agree with current therapy 2. Pneumonia treatment for hospitalist 3. Continue IV Lasix 4. Continue heparin drip for now; continue to cycle troponin 5. 2D echocardiogram 6. Further recommendations  pending patient's initial course.    Signed: Leanora Ivanoff PA-C 12/04/2019, 9:45 AM

## 2019-12-05 NOTE — ED Notes (Signed)
Pt asked to be taken off of bipap so he could attempt to eat lunch. Pt placed on 6L [place] and tray warmed up for pt. After being on 6L for about 10 minutes patient's O2 dropped to 88%, pt reports he did not feel more shob though, no extra work of breathing noted during this time. Pt placed back on bipap, O2 96% at this time. Wife at bedside. No further needs expressed

## 2019-12-05 NOTE — ED Notes (Signed)
RT at bedside, pt placed on BiPap at this time.

## 2019-12-05 NOTE — Consult Note (Signed)
ANTICOAGULATION CONSULT NOTE - Follow Up Consult  Pharmacy Consult for Heparin Indication: chest pain/ACS  Allergies  Allergen Reactions  . Penicillins Rash    Did it involve swelling of the face/tongue/throat, SOB, or low BP? No Did it involve sudden or severe rash/hives, skin peeling, or any reaction on the inside of your mouth or nose? Yes Did you need to seek medical attention at a hospital or doctor's office? No When did it last happen? If all above answers are "NO", may proceed with cephalosporin use.    Patient Measurements: Height: 5\' 8"  (172.7 cm) Weight: 59 kg (130 lb) IBW/kg (Calculated) : 68.4 Heparin Dosing Weight: 59 kg  Vital Signs: BP: 115/88 (06/28 1800) Pulse Rate: 97 (06/28 1807)  Labs: Recent Labs    11/27/2019 0525 12/03/2019 0525 11/15/2019 0728 12/01/2019 0849 12/07/2019 1044 12/04/2019 1502 11/17/2019 1834  HGB 10.8*  --   --   --   --   --   --   HCT 33.1*  --   --   --   --   --   --   PLT 268  --   --   --   --   --   --   APTT  --   --   --  24  --   --   --   LABPROT  --   --   --  13.3  --   --   --   INR  --   --   --  1.1  --   --   --   HEPARINUNFRC  --   --   --   --   --  <3.60* 0.25*  CREATININE 1.62*  --   --   --   --   --   --   TROPONINIHS 151*   < > 144* 129* 154*  --   --    < > = values in this interval not displayed.    Estimated Creatinine Clearance: 39.5 mL/min (A) (by C-G formula based on SCr of 1.62 mg/dL (H)).   Medications:  (Not in a hospital admission)  Scheduled:  . benzonatate  100 mg Oral TID  . fluticasone  2 spray Each Nare Daily  . furosemide  40 mg Intravenous BID  . heparin  900 Units Intravenous Once  . insulin aspart  0-5 Units Subcutaneous QHS  . insulin aspart  0-9 Units Subcutaneous TID WC  . multivitamin with minerals  1 tablet Oral Daily  . pantoprazole  40 mg Oral Daily  . saccharomyces boulardii  250 mg Oral q morning - 10a  . sodium chloride flush  3 mL Intravenous Q12H   Infusions:  .  sodium chloride    . azithromycin Stopped (11/10/2019 1046)  . cefTRIAXone (ROCEPHIN)  IV Stopped (11/15/2019 0935)  . heparin 700 Units/hr (11/17/2019 0942)   PRN:   Assessment: Pharmacy consulted to start heparin for ACS. No DOAC noted PTA.   6/28 Give 3500 units bolus x 1 Start heparin infusion at 700 units/hr  Goal of Therapy:  Heparin level 0.3-0.7 units/ml Monitor platelets by anticoagulation protocol: Yes   Plan:  6/28 @ 1834 HL= 0.25.subtherapeutic.   HL was originally drawn at 1502 and = <3.60. Spoke with RN who did not pause the Heparin drip and drew from same side Heparin running.  Redrew stat to evaluate. Give 900 units bolus x 1 Increase heparin infusion to 800 units/hr reCheck anti-Xa level in 6  hours and daily while on heparin Continue to monitor H&H and platelets  Maddix Kliewer A, PharmD, BCPS 11/26/2019,7:00 PM

## 2019-12-06 ENCOUNTER — Encounter: Payer: Self-pay | Admitting: Internal Medicine

## 2019-12-06 DIAGNOSIS — J189 Pneumonia, unspecified organism: Secondary | ICD-10-CM

## 2019-12-06 LAB — CBC
HCT: 31.9 % — ABNORMAL LOW (ref 39.0–52.0)
Hemoglobin: 10.5 g/dL — ABNORMAL LOW (ref 13.0–17.0)
MCH: 29.9 pg (ref 26.0–34.0)
MCHC: 32.9 g/dL (ref 30.0–36.0)
MCV: 90.9 fL (ref 80.0–100.0)
Platelets: 268 10*3/uL (ref 150–400)
RBC: 3.51 MIL/uL — ABNORMAL LOW (ref 4.22–5.81)
RDW: 14 % (ref 11.5–15.5)
WBC: 12.5 10*3/uL — ABNORMAL HIGH (ref 4.0–10.5)
nRBC: 0 % (ref 0.0–0.2)

## 2019-12-06 LAB — GLUCOSE, CAPILLARY
Glucose-Capillary: 151 mg/dL — ABNORMAL HIGH (ref 70–99)
Glucose-Capillary: 178 mg/dL — ABNORMAL HIGH (ref 70–99)
Glucose-Capillary: 157 mg/dL — ABNORMAL HIGH (ref 70–99)
Glucose-Capillary: 156 mg/dL — ABNORMAL HIGH (ref 70–99)

## 2019-12-06 LAB — BASIC METABOLIC PANEL
Anion gap: 13 (ref 5–15)
BUN: 34 mg/dL — ABNORMAL HIGH (ref 8–23)
CO2: 26 mmol/L (ref 22–32)
Calcium: 8.5 mg/dL — ABNORMAL LOW (ref 8.9–10.3)
Chloride: 98 mmol/L (ref 98–111)
Creatinine, Ser: 1.56 mg/dL — ABNORMAL HIGH (ref 0.61–1.24)
GFR calc Af Amer: 54 mL/min — ABNORMAL LOW (ref >60)
GFR calc non Af Amer: 47 mL/min — ABNORMAL LOW (ref >60)
Glucose, Bld: 165 mg/dL — ABNORMAL HIGH (ref 70–99)
Potassium: 4.4 mmol/L (ref 3.5–5.1)
Sodium: 137 mmol/L (ref 135–145)

## 2019-12-06 LAB — STREP PNEUMONIAE URINARY ANTIGEN
Strep Pneumo Urinary Antigen: NEGATIVE
Strep Pneumo Urinary Antigen: NEGATIVE

## 2019-12-06 LAB — HEPARIN LEVEL (UNFRACTIONATED)
Heparin Unfractionated: 0.23 IU/mL — ABNORMAL LOW (ref 0.30–0.70)
Heparin Unfractionated: 0.27 IU/mL — ABNORMAL LOW (ref 0.30–0.70)

## 2019-12-06 LAB — TROPONIN I (HIGH SENSITIVITY)
Troponin I (High Sensitivity): 203 ng/L (ref <18)
Troponin I (High Sensitivity): 218 ng/L (ref <18)
Troponin I (High Sensitivity): 189 ng/L (ref <18)
Troponin I (High Sensitivity): 181 ng/L (ref <18)

## 2019-12-06 LAB — EXPECTORATED SPUTUM ASSESSMENT W GRAM STAIN, RFLX TO RESP C

## 2019-12-06 LAB — PROCALCITONIN: Procalcitonin: <0.1 ng/mL

## 2019-12-06 MED ORDER — GUAIFENESIN 100 MG/5ML PO SOLN
10.0000 mL | ORAL | Status: DC | PRN
  Administered 2019-12-06: 200 mg via ORAL
  Filled 2019-12-06: qty 10

## 2019-12-06 MED ORDER — AZITHROMYCIN 250 MG PO TABS
500.0000 mg | ORAL_TABLET | Freq: Every day | ORAL | Status: DC
  Administered 2019-12-07 – 2019-12-08 (×2): 500 mg via ORAL
  Filled 2019-12-06 (×2): qty 2

## 2019-12-06 MED ORDER — ENOXAPARIN SODIUM 40 MG/0.4ML ~~LOC~~ SOLN
40.0000 mg | SUBCUTANEOUS | Status: DC
  Administered 2019-12-06 – 2019-12-08 (×3): 40 mg via SUBCUTANEOUS
  Filled 2019-12-06 (×3): qty 0.4

## 2019-12-06 NOTE — Progress Notes (Signed)
Correct lab value for 6/28 reported. Error for heparin fraction less than 3.60. actual value heparin fraction greater than 3.60. Pharmacy notified. MD on call notified.

## 2019-12-06 NOTE — Progress Notes (Signed)
PHARMACIST - PHYSICIAN COMMUNICATION DR:   Jerral Ralph CONCERNING: Antibiotic IV to Oral Route Change Policy  RECOMMENDATION: This patient is receiving azithromycin by the intravenous route.  Based on criteria approved by the Pharmacy and Therapeutics Committee, the antibiotic(s) is/are being converted to the equivalent oral dose form(s).   DESCRIPTION: These criteria include:  Patient being treated for a respiratory tract infection, urinary tract infection, cellulitis or clostridium difficile associated diarrhea if on metronidazole  The patient is not neutropenic and does not exhibit a GI malabsorption state  The patient is eating (either orally or via tube) and/or has been taking other orally administered medications for a least 24 hours  The patient is improving clinically and has a Tmax < 100.5  If you have questions about this conversion, please contact the Pharmacy Department   Albina Billet, PharmD, BCPS Clinical Pharmacist 12/06/2019 9:47 AM

## 2019-12-06 NOTE — Progress Notes (Signed)
PROGRESS NOTE    Terrance Watson  OFH:[id number] DOB: 06/14/1955 DOA: 12-26-2019 PCP: Kandyce Rud, MD    Brief Narrative:  Patient is 64 year old gentleman with history of type 2 diabetes on oral hypoglycemics, hypertension, chronic kidney disease stage IIIa with baseline creatinine 1.3 presenting to the emergency room with about 1 month of gradually worsening shortness of breath, worse for last few days.  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  [place], he started feeling excessively short of breath. In the emergency room, initially in respiratory distress needing BiPAP, needed 6 L to keep up oxygenation.  Chest x-ray showed bilateral interstitial edema.  CTA of the chest negative for PE, shows bilateral pleural effusion, multifocal airspace consolidation.  Echocardiogram shows acute systolic heart failure.   Assessment & Plan:   Principal Problem:   Respiratory failure with hypoxia (HCC) Active Problems:   Diabetes mellitus without complication (HCC)   Elevated troponin   CKD (chronic kidney disease), stage III   Hyperkalemia   Acute CHF (congestive heart failure) (HCC)   Pneumonia  Acute respiratory failure with hypoxia/acute respiratory distress: Suspect two combined causes,  Acute systolic congestive heart failure: Given his progressive symptoms for about a month this is likely diagnosis.  Other hypertensive heart disease or ischemic heart disease. Treated with BiPAP and now stabilizing.  On 4 L oxygen today. Continue IV Lasix twice a day.  Daily weight.  Intake and output monitoring.  Echocardiogram with ejection fraction 35%. Followed by cardiology.  They plan to stabilize and do ischemic evaluation as outpatient. Will benefit with gradual introduction of heart failure regimen once he  stabilizes.  Multifocal pneumonia: Suspected.  His abnormalities could possibly all from congestive heart failure given absence of fever, sputum production, negative procalcitonin and normal white cell count. Symptom onset more than a month.  Possibility of atypical pneumonia. Check Legionella, mycoplasma antigen, streptococcal antigen Start chest physiotherapy and mucolytic, induces sputum to check for Gram stain and culture. We will continue Rocephin and azithromycin.  Will need repeat imaging studies in 2 to 3 weeks to ensure stabilization.  Acute kidney injury on chronic kidney disease stage IIIa: Outpatient report with creatinine 1.3.  Will treat with Lasix and monitor closely.  Type 2 diabetes, uncontrolled with hyperglycemia: On oral hypoglycemics at home.  Will keep on sliding scale insulin.  Resume oral hypoglycemics on discharge.  Elevated troponin: Followed by cardiology.  Without chest pain.  New diagnosis of congestive heart failure. Will need ischemic evaluation.  Cardiology planning as outpatient.  Will monitor.   DVT prophylaxis: Heparin infusion.  Will discontinue.  Start on Lovenox subcu.   Code Status: Full code Family Communication: Wife at bedside Disposition Plan: Status is: Inpatient  Remains inpatient appropriate because:IV treatments appropriate due to intensity of illness or inability to take PO and Inpatient level of care appropriate due to severity of illness   Dispo: The patient is from: Home              Anticipated d/c is to: Home              Anticipated d/c date is: 2 days              Patient currently is not medically stable to d/c.         Consultants:  Cardiology  Procedures:   None  Antimicrobials:  Antibiotics Given (last 72 hours)    Date/Time Action Medication Dose Rate   11/10/2019 0905 New Bag/Given   cefTRIAXone (ROCEPHIN) 2 g in sodium chloride 0.9 % 100 mL IVPB 2 g 200 mL/hr   12/06/2019 0945 New Bag/Given   azithromycin  (ZITHROMAX) 500 mg in sodium chloride 0.9 % 250 mL IVPB 500 mg 250 mL/hr   12/06/19 0827 New Bag/Given   cefTRIAXone (ROCEPHIN) 2 g in sodium chloride 0.9 % 100 mL IVPB 2 g 200 mL/hr   12/06/19 0934 New Bag/Given   azithromycin (ZITHROMAX) 500 mg in sodium chloride 0.9 % 250 mL IVPB 500 mg 250 mL/hr         Subjective: Patient was seen and examined.  He feels his shortness of breath is slightly better than how he came to the emergency room.  Currently on 4 L oxygen.  Able to talk in full sentences while at rest.  Has not ambulated yet.  Denies any chest pain.  Occasional dry cough but no sputum production.  Afebrile.  Objective: Vitals:   12/06/19 0400 12/06/19 0511 12/06/19 0752 12/06/19 1214  BP:  112/75 119/72 100/69  Pulse:  96 100 100  Resp: (!) 21  (!) 24 20  Temp:  98.7 F (37.1 C) 98.4 F (36.9 C)   TempSrc:  Oral Oral   SpO2:  98% 94% 97%  Weight:  70.5 kg    Height:        Intake/Output Summary (Last 24 hours) at 12/06/2019 1304 Last data filed at 12/06/2019 0950 Gross per 24 hour  Intake 411.68 ml  Output 500 ml  Net -88.32 ml   Filed Weights   11/21/2019 0522 11/29/2019 2249 12/06/19 0511  Weight: 59 kg 70.1 kg 70.5 kg    Examination:  General exam: Appears calm and comfortable  Respiratory system: Decreased air entry at bases.  No added sounds.  Not in any distress.  Currently on 4 L oxygen. Cardiovascular system: S1 & S2 heard, RRR. No pedal edema. Gastrointestinal system: Abdomen is nondistended, soft and nontender.  Central nervous system: Alert and oriented. No focal neurological deficits. Extremities: Symmetric 5 x 5 power. Skin: No rashes, lesions or ulcers Psychiatry: Judgement and insight appear normal. Mood & affect appropriate.     Data Reviewed: I have personally reviewed following labs and imaging studies  CBC: Recent Labs  Lab 11/11/2019 0525 12/06/19 0043  WBC 8.4 12.5*  HGB 10.8* 10.5*  HCT 33.1* 31.9*  MCV 89.9 90.9  PLT 268 268    Basic Metabolic Panel: Recent Labs  Lab 11/19/2019 0525 12/06/19 0043  NA 136 137  K 5.3* 4.4  CL 98 98  CO2 26 26  GLUCOSE 237* 165*  BUN 29* 34*  CREATININE 1.62* 1.56*  CALCIUM 8.5* 8.5*   GFR: Estimated Creatinine Clearance: 47.5 mL/min (A) (by C-G formula based on SCr of 1.56 mg/dL (H)). Liver Function Tests: Recent Labs  Lab 11/15/2019 0525  AST 27  ALT 26  ALKPHOS 85  BILITOT 1.1  PROT 6.3*  ALBUMIN 3.3*   No results for input(s): LIPASE, AMYLASE in the last 168 hours. No results for input(s): AMMONIA in the last 168 hours. Coagulation Profile: Recent Labs  Lab 11/12/2019 0849  INR 1.1   Cardiac Enzymes: No results for input(s): CKTOTAL, CKMB, CKMBINDEX, TROPONINI in the last 168 hours. BNP (last 3 results) No results for input(s): PROBNP in the last 8760 hours. HbA1C: Recent Labs  11/15/2019 0849  HGBA1C 7.2*   CBG: Recent Labs  Lab 11/19/2019 1150 11/14/2019 1643 11/14/2019 2127 12/06/19 0754 12/06/19 1215  GLUCAP 203* 166* 136* 151* 178*   Lipid Profile: No results for input(s): CHOL, HDL, LDLCALC, TRIG, CHOLHDL, LDLDIRECT in the last 72 hours. Thyroid Function Tests: No results for input(s): TSH, T4TOTAL, FREET4, T3FREE, THYROIDAB in the last 72 hours. Anemia Panel: No results for input(s): VITAMINB12, FOLATE, FERRITIN, TIBC, IRON, RETICCTPCT in the last 72 hours. Sepsis Labs: Recent Labs  Lab 11/17/2019 0525 11/12/2019 0554 11/25/2019 0728 12/06/19 0043  PROCALCITON <0.10  --   --  <0.10  LATICACIDVEN  --  2.2* 2.2*  --     Recent Results (from the past 240 hour(s))  SARS Coronavirus 2 by RT PCR (hospital order, performed in [facility] hospital lab) Nasopharyngeal Nasopharyngeal Swab     Status: None   Collection Time: 11/12/2019  5:25 AM   Specimen: Nasopharyngeal Swab  Result Value Ref Range Status   SARS Coronavirus 2 NEGATIVE NEGATIVE Final    Comment: (NOTE) SARS-CoV-2 target nucleic acids are NOT DETECTED.  The SARS-CoV-2 RNA is  generally detectable in upper and lower respiratory specimens during the acute phase of infection. The lowest concentration of SARS-CoV-2 viral copies this assay can detect is 250 copies / mL. A negative result does not preclude SARS-CoV-2 infection and should not be used as the sole basis for treatment or other patient management decisions.  A negative result may occur with improper specimen collection / handling, submission of specimen other than nasopharyngeal swab, presence of viral mutation(s) within the areas targeted by this assay, and inadequate number of viral copies (<250 copies / mL). A negative result must be combined with clinical observations, patient history, and epidemiological information.  Fact Sheet for Patients:   BoilerBrush.com.cy  Fact Sheet for Healthcare Providers: https://pope.com/  This test is not yet approved or  cleared by the [place] FDA and has been authorized for detection and/or diagnosis of SARS-CoV-2 by FDA under an Emergency Use Authorization (EUA).  This EUA will remain in effect (meaning this test can be used) for the duration of the COVID-19 declaration under Section 564(b)(1) of the Act, 21 U.S.C. section 360bbb-3(b)(1), unless the authorization is terminated or revoked sooner.  Performed at [facility], [address] [place], [place] [address]   Blood culture (routine x 2)     Status: None (Preliminary result)   Collection Time: 11/21/2019  5:54 AM   Specimen: BLOOD  Result Value Ref Range Status   Specimen Description BLOOD LEFT AC  Final   Special Requests   Final    BOTTLES DRAWN AEROBIC AND ANAEROBIC Blood Culture results may not be optimal due to an excessive volume of blood received in culture bottles   Culture   Final    NO GROWTH 1 DAY Performed at [facility], [address], [place], [place] [address]    Report Status PENDING  Incomplete  Blood  culture (routine x 2)     Status: None (Preliminary result)   Collection Time: 11/27/2019  6:13 AM   Specimen: BLOOD  Result Value Ref Range Status   Specimen Description BLOOD RIGHT HAND  Final   Special Requests   Final    BOTTLES DRAWN AEROBIC AND ANAEROBIC Blood Culture results may not be optimal due to an inadequate volume of blood received in culture bottles   Culture   Final    NO GROWTH 1 DAY Performed at  [facility], [address] [place], [place] [address]    Report Status PENDING  Incomplete         Radiology Studies: CT Angio Chest PE W and/or Wo Contrast  Result Date: 11/18/2019 CLINICAL DATA:  Shortness of breath decreased oxygen saturation EXAM: CT ANGIOGRAPHY CHEST WITH CONTRAST TECHNIQUE: Multidetector CT imaging of the chest was performed using the standard protocol during bolus administration of intravenous contrast. Multiplanar CT image reconstructions and MIPs were obtained to evaluate the vascular anatomy. CONTRAST:  55mL OMNIPAQUE IOHEXOL 350 MG/ML SOLN COMPARISON:  Chest radiograph December 05, 2019 FINDINGS: Cardiovascular: There is no demonstrable pulmonary embolus. There is no thoracic aortic aneurysm. No dissection is evident; note that the contrast bolus in the aorta is not sufficient for potential dissection assessment. There are scattered foci calcification in the visualized great vessels. There are foci aortic atherosclerosis. There are multiple foci of coronary artery calcification. There is no appreciable pericardial thickening. Minimal pericardial effusion may be within physiologic range. Mediastinum/Nodes: Wound visualized thyroid appears unremarkable. There are scattered subcentimeter mediastinal lymph nodes. There is a subcarinal lymph node measuring 1.0 x 1.0 cm. No esophageal lesions are appreciable. Lungs/Pleura: There are moderate free-flowing pleural effusions bilaterally. There is airspace consolidation in each lower lobe. There is airspace  opacity in the right upper lobe centrally. There is airspace opacity in the posterior segment of the left upper lobe as well. There are multiple nodular opacities throughout the portions of lung which are not involved with consolidation or effusion. Nodular opacities range in size from as small as 2 mm to as large as 6 mm. These nodular opacities are seen diffusely throughout the upper lobes CT may well be present in other areas which currently cannot be assessed due to pleural effusion and consolidation. Upper Abdomen: There is fatty infiltration near the fissure for the ligamentum teres. There is an apparent adenoma in the left adrenal measuring 1.0 x 0.7 cm. Visualized upper abdominal structures otherwise appear unremarkable. Musculoskeletal: No blastic or lytic bone lesions. No chest wall lesions evident. Review of the MIP images confirms the above findings. IMPRESSION: 1. No demonstrable pulmonary embolus. No thoracic aortic aneurysm. No dissection evident with note that the contrast bolus is not sufficient for dissection assessment. There is aortic atherosclerosis as well as multiple foci of coronary artery calcification and scattered foci of great vessel calcification. 2. Moderate free-flowing pleural effusions bilaterally with multifocal airspace consolidation, most severe in the lower lobe regions bilaterally. Suspect multifocal pneumonia. 3. Multiple nodular opacities throughout the aerated portions of the lung ranging in size from 2-6 mm. Concern for potential neoplastic etiology given these multiple noncalcified nodular lesions. 4. Borderline prominent subcarinal lymph node. Other subcentimeter lymph nodes noted throughout the mediastinum. 5.  Benign left adrenal adenoma. Aortic Atherosclerosis (ICD10-I70.0). Electronically Signed   By: Bretta Bang III M.D.   On: 11/22/2019 08:13   DG Chest Portable 1 View  Result Date: 11/24/2019 CLINICAL DATA:  Shortness of breath. EXAM: PORTABLE CHEST 1 VIEW  COMPARISON:  None. FINDINGS: The heart is mildly enlarged. The mediastinal and hilar contours are grossly normal. Increased interstitial markings and Kerley B lines consistent with interstitial pulmonary edema. Small left pleural effusion and bibasilar atelectasis. The bony thorax is intact. IMPRESSION: CHF with small left effusion and bibasilar atelectasis. Electronically Signed   By: Rudie Meyer M.D.   On: 11/25/2019 05:46   ECHOCARDIOGRAM COMPLETE  Result Date: 11/28/2019    ECHOCARDIOGRAM REPORT   Patient Name:  Altamease Oiler Date of Exam: 12-Dec-2019 Medical Rec #:  [id number]      Height:       68.0 in Accession #:    [id number]     Weight:       130.0 lb Date of Birth:  03-23-1956      BSA:          1.702 m Patient Age:    63 years       BP:           95/75 mmHg Patient Gender: M              HR:           94 bpm. Exam Location:  [facility] Procedure: 2D Echo, Color Doppler and Cardiac Doppler Indications:     I50.9 Congestive Heart Failure  History:         Patient has no prior history of Echocardiogram examinations.                  Risk Factors:Hypertension and Diabetes.  Sonographer:     Humphrey Rolls RDCS (AE) Referring Phys:  [id number] [employer] Shutters Diagnosing Phys: Marcina Millard MD  Sonographer Comments: Suboptimal parasternal window. IMPRESSIONS  1. Left ventricular ejection fraction, by estimation, is 35 to 40%. The left ventricle has moderately decreased function. The left ventricle has no regional wall motion abnormalities. Left ventricular diastolic parameters were normal.  2. Right ventricular systolic function is normal. The right ventricular size is normal.  3. The mitral valve is normal in structure. Moderate mitral valve regurgitation. No evidence of mitral stenosis.  4. Tricuspid valve regurgitation is mild to moderate.  5. The aortic valve is normal in structure. Aortic valve regurgitation is not visualized. No aortic stenosis is present.  6. The inferior vena cava is normal in size with  greater than 50% respiratory variability, suggesting right atrial pressure of 3 mmHg. FINDINGS  Left Ventricle: Left ventricular ejection fraction, by estimation, is 35 to 40%. The left ventricle has moderately decreased function. The left ventricle has no regional wall motion abnormalities. The left ventricular internal cavity size was normal in size. There is no left ventricular hypertrophy. Left ventricular diastolic parameters were normal. Right Ventricle: The right ventricular size is normal. No increase in right ventricular wall thickness. Right ventricular systolic function is normal. Left Atrium: Left atrial size was normal in size. Right Atrium: Right atrial size was normal in size. Pericardium: There is no evidence of pericardial effusion. Mitral Valve: The mitral valve is normal in structure. Normal mobility of the mitral valve leaflets. Moderate mitral valve regurgitation. No evidence of mitral valve stenosis. MV peak gradient, 6.7 mmHg. The mean mitral valve gradient is 3.0 mmHg. Tricuspid Valve: The tricuspid valve is normal in structure. Tricuspid valve regurgitation is mild to moderate. No evidence of tricuspid stenosis. Aortic Valve: The aortic valve is normal in structure. Aortic valve regurgitation is not visualized. No aortic stenosis is present. Aortic valve mean gradient measures 2.0 mmHg. Aortic valve peak gradient measures 3.4 mmHg. Aortic valve area, by VTI measures 2.26 cm. Pulmonic Valve: The pulmonic valve was normal in structure. Pulmonic valve regurgitation is not visualized. No evidence of pulmonic stenosis. Aorta: The aortic root is normal in size and structure. Venous: The inferior vena cava is normal in size with greater than 50% respiratory variability, suggesting right atrial pressure of 3 mmHg. IAS/Shunts: No atrial level shunt detected by color flow Doppler.  LEFT VENTRICLE PLAX 2D LVIDd:  4.43 cm  Diastology LVIDs:         3.58 cm  LV e' lateral:   7.29 cm/s LV PW:          0.79 cm  LV E/e' lateral: 15.9 LV IVS:        0.84 cm  LV e' medial:    6.20 cm/s LVOT diam:     1.90 cm  LV E/e' medial:  18.7 LV SV:         33 LV SV Index:   19 LVOT Area:     2.84 cm  RIGHT VENTRICLE RV Basal diam:  2.77 cm LEFT ATRIUM             Index       RIGHT ATRIUM           Index LA diam:        3.90 cm 2.29 cm/m  RA Area:     13.50 cm LA Vol (A2C):   62.1 ml 36.49 ml/m RA Volume:   31.10 ml  18.27 ml/m LA Vol (A4C):   51.5 ml 30.26 ml/m LA Biplane Vol: 63.1 ml 37.08 ml/m  AORTIC VALVE                   PULMONIC VALVE AV Area (Vmax):    2.08 cm    PV Vmax:       0.71 m/s AV Area (Vmean):   1.85 cm    PV Vmean:      48.100 cm/s AV Area (VTI):     2.26 cm    PV VTI:        0.097 m AV Vmax:           92.40 cm/s  PV Peak grad:  2.0 mmHg AV Vmean:          68.700 cm/s PV Mean grad:  1.0 mmHg AV VTI:            0.144 m AV Peak Grad:      3.4 mmHg AV Mean Grad:      2.0 mmHg LVOT Vmax:         67.90 cm/s LVOT Vmean:        44.800 cm/s LVOT VTI:          0.115 m LVOT/AV VTI ratio: 0.80  AORTA Ao Root diam: 2.80 cm MITRAL VALVE MV Area (PHT): 8.43 cm     SHUNTS MV Peak grad:  6.7 mmHg     Systemic VTI:  0.12 m MV Mean grad:  3.0 mmHg     Systemic Diam: 1.90 cm MV Vmax:       1.29 m/s MV Vmean:      73.6 cm/s MV Decel Time: 90 msec MV E velocity: 116.00 cm/s MV A velocity: 75.40 cm/s MV E/A ratio:  1.54 Marcina Millard MD Electronically signed by Marcina Millard MD Signature Date/Time: 11/16/2019/4:52:16 PM    Final         Scheduled Meds:  Melene Muller ON 12/07/2019] azithromycin  500 mg Oral Daily   benzonatate  100 mg Oral TID   enoxaparin (LOVENOX) injection  40 mg Subcutaneous Q24H   fluticasone  2 spray Each Nare Daily   furosemide  40 mg Intravenous BID   insulin aspart  0-5 Units Subcutaneous QHS   insulin aspart  0-9 Units Subcutaneous TID WC   multivitamin with minerals  1 tablet Oral Daily   pantoprazole  40 mg Oral Daily   saccharomyces boulardii  250 mg Oral q  morning - 10a   sodium chloride flush  3 mL Intravenous Q12H   Continuous Infusions:  sodium chloride     cefTRIAXone (ROCEPHIN)  IV 2 g (12/06/19 0827)     LOS: 1 day    Time spent:  35 minutes     Dorcas Carrow, MD [facility] Pager [phone]

## 2019-12-06 NOTE — Progress Notes (Signed)
Evaluated patient this afternoon with wife at the bedside. The patient denies chest pain of any sort. He has been able to ambulate from the bed to the bathroom without chest pain, though does have mild shortness of breath and is still requiring supplemental oxygen. He is pleasant, smiling, and in no acute distress at this time. I discussed the echocardiogram findings as well as lab results with the patient and wife, who agree with deferring invasive cardiac diagnostics or stress testing during this admission, and following up as outpatient with Dr. Darrold Junker for further ischemic work-up. Repeat ECG revealed, and does not reveal evidence of ischemia.

## 2019-12-06 NOTE — Consult Note (Addendum)
ANTICOAGULATION CONSULT NOTE - Follow Up Consult  Pharmacy Consult for Heparin Indication: chest pain/ACS  Allergies  Allergen Reactions   Penicillins Rash    Did it involve swelling of the face/tongue/throat, SOB, or low BP? No Did it involve sudden or severe rash/hives, skin peeling, or any reaction on the inside of your mouth or nose? Yes Did you need to seek medical attention at a hospital or doctor's office? No When did it last happen? If all above answers are NO, may proceed with cephalosporin use.    Patient Measurements: Height: 5\' 8"  (172.7 cm) Weight: 70.5 kg (155 lb 8 oz) IBW/kg (Calculated) : 68.4 Heparin Dosing Weight: 59 kg  Vital Signs: Temp: 98.4 F (36.9 C) (06/29 0752) Temp Source: Oral (06/29 0752) BP: 119/72 (06/29 0752) Pulse Rate: 100 (06/29 0752)  Labs: Recent Labs    11/25/2019 0525 12/02/2019 0728 12/03/2019 0849 11/22/2019 1044 11/14/2019 1502 11/15/2019 1834 12/06/19 0043 12/06/19 0838  HGB 10.8*  --   --   --   --   --  10.5*  --   HCT 33.1*  --   --   --   --   --  31.9*  --   PLT 268  --   --   --   --   --  268  --   APTT  --   --  24  --   --   --   --   --   LABPROT  --   --  13.3  --   --   --   --   --   INR  --   --  1.1  --   --   --   --   --   HEPARINUNFRC  --   --   --   --    < > 0.25* 0.23* 0.27*  CREATININE 1.62*  --   --   --   --   --  1.56*  --   TROPONINIHS 151*   < > 129* 154*  --   --   --  203*   < > = values in this interval not displayed.    Estimated Creatinine Clearance: 47.5 mL/min (A) (by C-G formula based on SCr of 1.56 mg/dL (H)).   Medications:  Medications Prior to Admission  Medication Sig Dispense Refill Last Dose   brompheniramine-pseudoephedrine-DM 30-2-10 MG/5ML syrup Take 10 mLs by mouth every 4 (four) hours as needed.   12/04/2019 at Unknown time   doxycycline (VIBRAMYCIN) 100 MG capsule Take 100 mg by mouth 2 (two) times daily.   12/04/2019 at Unknown time   ezetimibe (ZETIA) 10 MG tablet Take  10 mg by mouth daily.   12/04/2019 at Unknown time   glipiZIDE (GLUCOTROL) 5 MG tablet Take 5 mg by mouth daily before breakfast.   12/04/2019 at Unknown time   metFORMIN (GLUCOPHAGE) 500 MG tablet Take 500 mg by mouth daily with breakfast.    12/04/2019 at Unknown time   Multiple Vitamin (MULTIVITAMIN) tablet Take 1 tablet by mouth daily.   12/04/2019 at Unknown time   Omeprazole-Sodium Bicarbonate (ZEGERID) 20-1100 MG CAPS capsule Take 1 capsule by mouth daily before breakfast.   12/04/2019 at Unknown time   benzonatate (TESSALON) 100 MG capsule Take 100 mg by mouth 3 (three) times daily as needed.   PRN at PRN   fluticasone (FLONASE) 50 MCG/ACT nasal spray Place 2 sprays into both nostrils daily.   PRN at PRN  Scheduled:   benzonatate  100 mg Oral TID   fluticasone  2 spray Each Nare Daily   furosemide  40 mg Intravenous BID   insulin aspart  0-5 Units Subcutaneous QHS   insulin aspart  0-9 Units Subcutaneous TID WC   multivitamin with minerals  1 tablet Oral Daily   pantoprazole  40 mg Oral Daily   saccharomyces boulardii  250 mg Oral q morning - 10a   sodium chloride flush  3 mL Intravenous Q12H   Infusions:   sodium chloride     azithromycin Stopped (12/02/2019 1046)   cefTRIAXone (ROCEPHIN)  IV 2 g (12/06/19 0827)   PRN:   Assessment: Pharmacy consulted to start heparin for ACS. No DOAC noted PTA.   Initially given 3500 units bolus x 1, followed by heparin infusion at 700 units/hr  06/28 @ 1834 HL 0.25 subtherapeutic - rate increased to 800 units/hr  06/29 @ 0100 HL 0.23 subtherapeutic - rate increased to 900 units/hr 06/29 @ 0838 HL 0.27  Goal of Therapy:  Heparin level 0.3-0.7 units/ml Monitor platelets by anticoagulation protocol: Yes   Plan:  Will increase rate to 1000 units/hr and will recheck HL at 1600  Addendum: Per Cards, Discontinue heparin in the absence of chest pain, with flat troponin  CBC stable will continue to monitor  Albina Billet, PharmD, BCPS Clinical Pharmacist 12/06/2019 9:26 AM

## 2019-12-06 NOTE — Progress Notes (Addendum)
Patient reporting some mild mid-chest discomfort, but does not want pain medications at this time. Says it is brought on by coughing and feels like phlegm gets caught in his chest. Reports it eases up relatively quickly. Vitals are stable and troponins have started trending down. Sputum is blood-tinged, just sent down sample. He agreed to try the PRN guaifenesin to loosen the phlegm. Will monitor. Notified Dr. Jerral Ralph. No new orders.

## 2019-12-06 NOTE — Progress Notes (Signed)
[facility] Cardiology    SUBJECTIVE: The patient reports feeling much better this morning. He denies chest pain, pressure, tightness, or squeezing. His breathing has improved. He states he choked on a tablet this morning and had a coughing fit, but is now able to eat his breakfast slowly without difficulty swallowing.   Vitals:   12/06/19 0300 12/06/19 0400 12/06/19 0511 12/06/19 0752  BP:   112/75 119/72  Pulse:   96 100  Resp: 20 (!) 21  (!) 24  Temp:   98.7 F (37.1 C) 98.4 F (36.9 C)  TempSrc:   Oral Oral  SpO2:   98% 94%  Weight:   70.5 kg   Height:         Intake/Output Summary (Last 24 hours) at 12/06/2019 1610 Last data filed at 12/06/2019 0300 Gross per 24 hour  Intake 521.68 ml  Output 500 ml  Net 21.68 ml      PHYSICAL EXAM  General: Well developed, well nourished, in no acute distress, sitting up in bed, smiling, eating breakfast HEENT:  Normocephalic and atramatic Neck:  No JVD.  Lungs: normal effort of breathing on supplemental oxygen via nasal cannula, no wheezing, speaking in full sentences Heart: HRRR . Normal S1 and S2 without gallops or murmurs.  Abdomen: nondistended  Msk: gait not assessed Extremities: No clubbing, cyanosis or edema.   Neuro: Alert and oriented X 3. Psych:  Good affect, responds appropriately   LABS: Basic Metabolic Panel: Recent Labs    11/16/2019 0525 12/06/19 0043  NA 136 137  K 5.3* 4.4  CL 98 98  CO2 26 26  GLUCOSE 237* 165*  BUN 29* 34*  CREATININE 1.62* 1.56*  CALCIUM 8.5* 8.5*   Liver Function Tests: Recent Labs    11/10/2019 0525  AST 27  ALT 26  ALKPHOS 85  BILITOT 1.1  PROT 6.3*  ALBUMIN 3.3*   No results for input(s): LIPASE, AMYLASE in the last 72 hours. CBC: Recent Labs    11/19/2019 0525 12/06/19 0043  WBC 8.4 12.5*  HGB 10.8* 10.5*  HCT 33.1* 31.9*  MCV 89.9 90.9  PLT 268 268   Cardiac Enzymes: No results for input(s): CKTOTAL, CKMB, CKMBINDEX, TROPONINI in the last 72 hours. BNP: Invalid  input(s): POCBNP D-Dimer: No results for input(s): DDIMER in the last 72 hours. Hemoglobin A1C: Recent Labs    12/06/2019 0849  HGBA1C 7.2*   Fasting Lipid Panel: No results for input(s): CHOL, HDL, LDLCALC, TRIG, CHOLHDL, LDLDIRECT in the last 72 hours. Thyroid Function Tests: No results for input(s): TSH, T4TOTAL, T3FREE, THYROIDAB in the last 72 hours.  Invalid input(s): FREET3 Anemia Panel: No results for input(s): VITAMINB12, FOLATE, FERRITIN, TIBC, IRON, RETICCTPCT in the last 72 hours.  CT Angio Chest PE W and/or Wo Contrast  Result Date: 11/27/2019 CLINICAL DATA:  Shortness of breath decreased oxygen saturation EXAM: CT ANGIOGRAPHY CHEST WITH CONTRAST TECHNIQUE: Multidetector CT imaging of the chest was performed using the standard protocol during bolus administration of intravenous contrast. Multiplanar CT image reconstructions and MIPs were obtained to evaluate the vascular anatomy. CONTRAST:  29mL OMNIPAQUE IOHEXOL 350 MG/ML SOLN COMPARISON:  Chest radiograph December 05, 2019 FINDINGS: Cardiovascular: There is no demonstrable pulmonary embolus. There is no thoracic aortic aneurysm. No dissection is evident; note that the contrast bolus in the aorta is not sufficient for potential dissection assessment. There are scattered foci calcification in the visualized great vessels. There are foci aortic atherosclerosis. There are multiple foci of coronary artery calcification. There  is no appreciable pericardial thickening. Minimal pericardial effusion may be within physiologic range. Mediastinum/Nodes: Wound visualized thyroid appears unremarkable. There are scattered subcentimeter mediastinal lymph nodes. There is a subcarinal lymph node measuring 1.0 x 1.0 cm. No esophageal lesions are appreciable. Lungs/Pleura: There are moderate free-flowing pleural effusions bilaterally. There is airspace consolidation in each lower lobe. There is airspace opacity in the right upper lobe centrally. There is  airspace opacity in the posterior segment of the left upper lobe as well. There are multiple nodular opacities throughout the portions of lung which are not involved with consolidation or effusion. Nodular opacities range in size from as small as 2 mm to as large as 6 mm. These nodular opacities are seen diffusely throughout the upper lobes CT may well be present in other areas which currently cannot be assessed due to pleural effusion and consolidation. Upper Abdomen: There is fatty infiltration near the fissure for the ligamentum teres. There is an apparent adenoma in the left adrenal measuring 1.0 x 0.7 cm. Visualized upper abdominal structures otherwise appear unremarkable. Musculoskeletal: No blastic or lytic bone lesions. No chest wall lesions evident. Review of the MIP images confirms the above findings. IMPRESSION: 1. No demonstrable pulmonary embolus. No thoracic aortic aneurysm. No dissection evident with note that the contrast bolus is not sufficient for dissection assessment. There is aortic atherosclerosis as well as multiple foci of coronary artery calcification and scattered foci of great vessel calcification. 2. Moderate free-flowing pleural effusions bilaterally with multifocal airspace consolidation, most severe in the lower lobe regions bilaterally. Suspect multifocal pneumonia. 3. Multiple nodular opacities throughout the aerated portions of the lung ranging in size from 2-6 mm. Concern for potential neoplastic etiology given these multiple noncalcified nodular lesions. 4. Borderline prominent subcarinal lymph node. Other subcentimeter lymph nodes noted throughout the mediastinum. 5.  Benign left adrenal adenoma. Aortic Atherosclerosis (ICD10-I70.0). Electronically Signed   By: Bretta Bang III M.D.   On: 2019/12/30 08:13   DG Chest Portable 1 View  Result Date: 12-30-19 CLINICAL DATA:  Shortness of breath. EXAM: PORTABLE CHEST 1 VIEW COMPARISON:  None. FINDINGS: The heart is mildly  enlarged. The mediastinal and hilar contours are grossly normal. Increased interstitial markings and Kerley B lines consistent with interstitial pulmonary edema. Small left pleural effusion and bibasilar atelectasis. The bony thorax is intact. IMPRESSION: CHF with small left effusion and bibasilar atelectasis. Electronically Signed   By: Rudie Meyer M.D.   On: December 30, 2019 05:46   ECHOCARDIOGRAM COMPLETE  Result Date: Dec 30, 2019    ECHOCARDIOGRAM REPORT   Patient Name:   MICHAH MINTON Date of Exam: 12/30/19 Medical Rec #:  [id number]      Height:       68.0 in Accession #:    [id number]     Weight:       130.0 lb Date of Birth:  06-Feb-1956      BSA:          1.702 m Patient Age:    63 years       BP:           95/75 mmHg Patient Gender: M              HR:           94 bpm. Exam Location:  [facility] Procedure: 2D Echo, Color Doppler and Cardiac Doppler Indications:     I50.9 Congestive Heart Failure  History:         Patient has no prior history of Echocardiogram  examinations.                  Risk Factors:Hypertension and Diabetes.  Sonographer:     Humphrey Rolls RDCS (AE) Referring Phys:  [id number] Lucile Shutters Diagnosing Phys: Marcina Millard MD  Sonographer Comments: Suboptimal parasternal window. IMPRESSIONS  1. Left ventricular ejection fraction, by estimation, is 35 to 40%. The left ventricle has moderately decreased function. The left ventricle has no regional wall motion abnormalities. Left ventricular diastolic parameters were normal.  2. Right ventricular systolic function is normal. The right ventricular size is normal.  3. The mitral valve is normal in structure. Moderate mitral valve regurgitation. No evidence of mitral stenosis.  4. Tricuspid valve regurgitation is mild to moderate.  5. The aortic valve is normal in structure. Aortic valve regurgitation is not visualized. No aortic stenosis is present.  6. The inferior vena cava is normal in size with greater than 50% respiratory variability,  suggesting right atrial pressure of 3 mmHg. FINDINGS  Left Ventricle: Left ventricular ejection fraction, by estimation, is 35 to 40%. The left ventricle has moderately decreased function. The left ventricle has no regional wall motion abnormalities. The left ventricular internal cavity size was normal in size. There is no left ventricular hypertrophy. Left ventricular diastolic parameters were normal. Right Ventricle: The right ventricular size is normal. No increase in right ventricular wall thickness. Right ventricular systolic function is normal. Left Atrium: Left atrial size was normal in size. Right Atrium: Right atrial size was normal in size. Pericardium: There is no evidence of pericardial effusion. Mitral Valve: The mitral valve is normal in structure. Normal mobility of the mitral valve leaflets. Moderate mitral valve regurgitation. No evidence of mitral valve stenosis. MV peak gradient, 6.7 mmHg. The mean mitral valve gradient is 3.0 mmHg. Tricuspid Valve: The tricuspid valve is normal in structure. Tricuspid valve regurgitation is mild to moderate. No evidence of tricuspid stenosis. Aortic Valve: The aortic valve is normal in structure. Aortic valve regurgitation is not visualized. No aortic stenosis is present. Aortic valve mean gradient measures 2.0 mmHg. Aortic valve peak gradient measures 3.4 mmHg. Aortic valve area, by VTI measures 2.26 cm. Pulmonic Valve: The pulmonic valve was normal in structure. Pulmonic valve regurgitation is not visualized. No evidence of pulmonic stenosis. Aorta: The aortic root is normal in size and structure. Venous: The inferior vena cava is normal in size with greater than 50% respiratory variability, suggesting right atrial pressure of 3 mmHg. IAS/Shunts: No atrial level shunt detected by color flow Doppler.  LEFT VENTRICLE PLAX 2D LVIDd:         4.43 cm  Diastology LVIDs:         3.58 cm  LV e' lateral:   7.29 cm/s LV PW:         0.79 cm  LV E/e' lateral: 15.9 LV IVS:         0.84 cm  LV e' medial:    6.20 cm/s LVOT diam:     1.90 cm  LV E/e' medial:  18.7 LV SV:         33 LV SV Index:   19 LVOT Area:     2.84 cm  RIGHT VENTRICLE RV Basal diam:  2.77 cm LEFT ATRIUM             Index       RIGHT ATRIUM           Index LA diam:        3.90 cm  2.29 cm/m  RA Area:     13.50 cm LA Vol (A2C):   62.1 ml 36.49 ml/m RA Volume:   31.10 ml  18.27 ml/m LA Vol (A4C):   51.5 ml 30.26 ml/m LA Biplane Vol: 63.1 ml 37.08 ml/m  AORTIC VALVE                   PULMONIC VALVE AV Area (Vmax):    2.08 cm    PV Vmax:       0.71 m/s AV Area (Vmean):   1.85 cm    PV Vmean:      48.100 cm/s AV Area (VTI):     2.26 cm    PV VTI:        0.097 m AV Vmax:           92.40 cm/s  PV Peak grad:  2.0 mmHg AV Vmean:          68.700 cm/s PV Mean grad:  1.0 mmHg AV VTI:            0.144 m AV Peak Grad:      3.4 mmHg AV Mean Grad:      2.0 mmHg LVOT Vmax:         67.90 cm/s LVOT Vmean:        44.800 cm/s LVOT VTI:          0.115 m LVOT/AV VTI ratio: 0.80  AORTA Ao Root diam: 2.80 cm MITRAL VALVE MV Area (PHT): 8.43 cm     SHUNTS MV Peak grad:  6.7 mmHg     Systemic VTI:  0.12 m MV Mean grad:  3.0 mmHg     Systemic Diam: 1.90 cm MV Vmax:       1.29 m/s MV Vmean:      73.6 cm/s MV Decel Time: 90 msec MV E velocity: 116.00 cm/s MV A velocity: 75.40 cm/s MV E/A ratio:  1.54 Marcina Millard MD Electronically signed by Marcina Millard MD Signature Date/Time: Dec 20, 2019/4:52:16 PM    Final      Echo LVEF 35-40% with no wall motion abnormalities  TELEMETRY: sinus rhythm  ASSESSMENT AND PLAN:  Principal Problem:   Respiratory failure with hypoxia (HCC) Active Problems:   Diabetes mellitus without complication (HCC)   Elevated troponin   CKD (chronic kidney disease), stage III   Hyperkalemia   Acute CHF (congestive heart failure) (HCC)    1. Acute hypoxic respiratory failure, likely secondary to multifocal pneumonia and acute systolic CHF. Chest CT negative for PE, with moderate free-flowing  pleural effusions bilaterally with multifocal airspace consolidation, most severe in the lower lobe bilaterally, suspect multifocal pneumonia. BNP elevated to 718. Lactic acid 2.2. Patient reports feeling much better this morning. 2. Acute systolic CHF, with LVEF 35-40%, with no echocardiograms for comparison. On IV Lasix. Patient denies chest pain with improved breathing. 3. Elevated troponin (155, 144, 129, 154, 203), without peak or trough, likely secondary to demand supply ischemia in the setting of acute hypoxic respiratory failure. ECG with T wave inversions V5, V6 without ST elevation, without prior ECG for comparison. Patient denies chest pain. 4. Acute kidney injury, creatinine 1.56, BUN 34  Recommendations: 1. Discontinue heparin in the absence of chest pain, with flat troponin. 2. Repeat ECG this morning to assess for changes 3. Continue to trend troponin 4. Continue IV Lasix with careful monitoring of renal status 5. Continue antibiotics per hospitalist 6. Plan to discharge on beta blocker, Lasix, ACEi/ARB 7. Will continue  outpatient evaluation of reduced EF once pneumonia has resolved.  Leanora Ivanoff, PA-C 12/06/2019 8:58 AM  The patient was made in collaboration with Dr. Darrold Junker.

## 2019-12-06 NOTE — Consult Note (Signed)
ANTICOAGULATION CONSULT NOTE - Follow Up Consult  Pharmacy Consult for Heparin Indication: chest pain/ACS  Allergies  Allergen Reactions  . Penicillins Rash    Did it involve swelling of the face/tongue/throat, SOB, or low BP? No Did it involve sudden or severe rash/hives, skin peeling, or any reaction on the inside of your mouth or nose? Yes Did you need to seek medical attention at a hospital or doctor's office? No When did it last happen? If all above answers are "NO", may proceed with cephalosporin use.    Patient Measurements: Height: 5\' 8"  (172.7 cm) Weight: 70.1 kg (154 lb 8 oz) IBW/kg (Calculated) : 68.4 Heparin Dosing Weight: 59 kg  Vital Signs: Temp: 98.4 F (36.9 C) (06/28 2249) Temp Source: Oral (06/28 2249) BP: 116/69 (06/28 2249) Pulse Rate: 97 (06/28 2249)  Labs: Recent Labs    11/30/2019 0525 11/27/2019 0525 11/21/2019 0728 11/28/2019 0849 11/30/2019 1044 11/11/2019 1502 11/17/2019 1834 12/06/19 0043  HGB 10.8*  --   --   --   --   --   --  10.5*  HCT 33.1*  --   --   --   --   --   --  31.9*  PLT 268  --   --   --   --   --   --  268  APTT  --   --   --  24  --   --   --   --   LABPROT  --   --   --  13.3  --   --   --   --   INR  --   --   --  1.1  --   --   --   --   HEPARINUNFRC  --   --   --   --   --  >3.60* 0.25* 0.23*  CREATININE 1.62*  --   --   --   --   --   --  1.56*  TROPONINIHS 151*   < > 144* 129* 154*  --   --   --    < > = values in this interval not displayed.    Estimated Creatinine Clearance: 47.5 mL/min (A) (by C-G formula based on SCr of 1.56 mg/dL (H)).   Medications:  Medications Prior to Admission  Medication Sig Dispense Refill Last Dose  . brompheniramine-pseudoephedrine-DM 30-2-10 MG/5ML syrup Take 10 mLs by mouth every 4 (four) hours as needed.   12/04/2019 at Unknown time  . doxycycline (VIBRAMYCIN) 100 MG capsule Take 100 mg by mouth 2 (two) times daily.   12/04/2019 at Unknown time  . ezetimibe (ZETIA) 10 MG tablet Take  10 mg by mouth daily.   12/04/2019 at Unknown time  . glipiZIDE (GLUCOTROL) 5 MG tablet Take 5 mg by mouth daily before breakfast.   12/04/2019 at Unknown time  . metFORMIN (GLUCOPHAGE) 500 MG tablet Take 500 mg by mouth daily with breakfast.    12/04/2019 at Unknown time  . Multiple Vitamin (MULTIVITAMIN) tablet Take 1 tablet by mouth daily.   12/04/2019 at Unknown time  . Omeprazole-Sodium Bicarbonate (ZEGERID) 20-1100 MG CAPS capsule Take 1 capsule by mouth daily before breakfast.   12/04/2019 at Unknown time  . benzonatate (TESSALON) 100 MG capsule Take 100 mg by mouth 3 (three) times daily as needed.   PRN at PRN  . fluticasone (FLONASE) 50 MCG/ACT nasal spray Place 2 sprays into both nostrils daily.   PRN at PRN  Scheduled:  . benzonatate  100 mg Oral TID  . fluticasone  2 spray Each Nare Daily  . furosemide  40 mg Intravenous BID  . insulin aspart  0-5 Units Subcutaneous QHS  . insulin aspart  0-9 Units Subcutaneous TID WC  . multivitamin with minerals  1 tablet Oral Daily  . pantoprazole  40 mg Oral Daily  . saccharomyces boulardii  250 mg Oral q morning - 10a  . sodium chloride flush  3 mL Intravenous Q12H   Infusions:  . sodium chloride    . azithromycin Stopped (12/02/2019 1046)  . cefTRIAXone (ROCEPHIN)  IV Stopped (11/26/2019 0935)  . heparin 800 Units/hr (11/09/2019 1921)   PRN:   Assessment: Pharmacy consulted to start heparin for ACS. No DOAC noted PTA.   6/28 Give 3500 units bolus x 1 Start heparin infusion at 700 units/hr  Goal of Therapy:  Heparin level 0.3-0.7 units/ml Monitor platelets by anticoagulation protocol: Yes   Plan:  06/29 @ 0100 HL 0.23 subtherapeutic. Will increase rate to 900 units/hr and will recheck HL at 0830, CBC stable will continue to monitor.  Thomasene Ripple, PharmD, BCPS 12/06/2019,2:31 AM

## 2019-12-07 LAB — COMPREHENSIVE METABOLIC PANEL
ALT: 23 U/L (ref 0–44)
AST: 20 U/L (ref 15–41)
Albumin: 3.6 g/dL (ref 3.5–5.0)
Alkaline Phosphatase: 80 U/L (ref 38–126)
Anion gap: 16 — ABNORMAL HIGH (ref 5–15)
BUN: 43 mg/dL — ABNORMAL HIGH (ref 8–23)
CO2: 25 mmol/L (ref 22–32)
Calcium: 8.7 mg/dL — ABNORMAL LOW (ref 8.9–10.3)
Chloride: 95 mmol/L — ABNORMAL LOW (ref 98–111)
Creatinine, Ser: 1.58 mg/dL — ABNORMAL HIGH (ref 0.61–1.24)
GFR calc Af Amer: 54 mL/min — ABNORMAL LOW (ref >60)
GFR calc non Af Amer: 46 mL/min — ABNORMAL LOW (ref >60)
Glucose, Bld: 187 mg/dL — ABNORMAL HIGH (ref 70–99)
Potassium: 4.3 mmol/L (ref 3.5–5.1)
Sodium: 136 mmol/L (ref 135–145)
Total Bilirubin: 1.2 mg/dL (ref 0.3–1.2)
Total Protein: 6.8 g/dL (ref 6.5–8.1)

## 2019-12-07 LAB — EXPECTORATED SPUTUM ASSESSMENT W GRAM STAIN, RFLX TO RESP C

## 2019-12-07 LAB — LEGIONELLA PNEUMOPHILA SEROGP 1 UR AG
L. pneumophila Serogp 1 Ur Ag: NEGATIVE
L. pneumophila Serogp 1 Ur Ag: NEGATIVE

## 2019-12-07 LAB — CBC WITH DIFFERENTIAL/PLATELET
Abs Immature Granulocytes: 0.04 10*3/uL (ref 0.00–0.07)
Basophils Absolute: 0 10*3/uL (ref 0.0–0.1)
Basophils Relative: 0 %
Eosinophils Absolute: 0 10*3/uL (ref 0.0–0.5)
Eosinophils Relative: 0 %
HCT: 31.4 % — ABNORMAL LOW (ref 39.0–52.0)
Hemoglobin: 10.5 g/dL — ABNORMAL LOW (ref 13.0–17.0)
Immature Granulocytes: 0 %
Lymphocytes Relative: 4 %
Lymphs Abs: 0.4 10*3/uL — ABNORMAL LOW (ref 0.7–4.0)
MCH: 29.9 pg (ref 26.0–34.0)
MCHC: 33.4 g/dL (ref 30.0–36.0)
MCV: 89.5 fL (ref 80.0–100.0)
Monocytes Absolute: 0.6 10*3/uL (ref 0.1–1.0)
Monocytes Relative: 6 %
Neutro Abs: 9.4 10*3/uL — ABNORMAL HIGH (ref 1.7–7.7)
Neutrophils Relative %: 90 %
Platelets: 301 10*3/uL (ref 150–400)
RBC: 3.51 MIL/uL — ABNORMAL LOW (ref 4.22–5.81)
RDW: 13.9 % (ref 11.5–15.5)
WBC: 10.4 10*3/uL (ref 4.0–10.5)
nRBC: 0 % (ref 0.0–0.2)

## 2019-12-07 LAB — GLUCOSE, CAPILLARY
Glucose-Capillary: 175 mg/dL — ABNORMAL HIGH (ref 70–99)
Glucose-Capillary: 182 mg/dL — ABNORMAL HIGH (ref 70–99)
Glucose-Capillary: 150 mg/dL — ABNORMAL HIGH (ref 70–99)
Glucose-Capillary: 164 mg/dL — ABNORMAL HIGH (ref 70–99)

## 2019-12-07 LAB — MAGNESIUM: Magnesium: 2.1 mg/dL (ref 1.7–2.4)

## 2019-12-07 LAB — PHOSPHORUS: Phosphorus: 4.9 mg/dL — ABNORMAL HIGH (ref 2.5–4.6)

## 2019-12-07 LAB — BRAIN NATRIURETIC PEPTIDE: B Natriuretic Peptide: 1073 pg/mL — ABNORMAL HIGH (ref 0.0–100.0)

## 2019-12-07 LAB — MYCOPLASMA PNEUMONIAE ANTIBODY, IGM: Mycoplasma pneumo IgM: <770 U/mL (ref 0–769)

## 2019-12-07 MED ORDER — BENZONATATE 100 MG PO CAPS
200.0000 mg | ORAL_CAPSULE | Freq: Three times a day (TID) | ORAL | Status: DC
  Administered 2019-12-07 – 2019-12-09 (×5): 200 mg via ORAL
  Filled 2019-12-07 (×6): qty 2

## 2019-12-07 MED ORDER — GUAIFENESIN ER 600 MG PO TB12
1200.0000 mg | ORAL_TABLET | Freq: Two times a day (BID) | ORAL | Status: DC
  Administered 2019-12-07 – 2019-12-09 (×4): 1200 mg via ORAL
  Filled 2019-12-07 (×4): qty 2

## 2019-12-07 MED ORDER — ZOLPIDEM TARTRATE 5 MG PO TABS
5.0000 mg | ORAL_TABLET | Freq: Every evening | ORAL | Status: DC | PRN
  Administered 2019-12-07 – 2019-12-08 (×2): 5 mg via ORAL
  Filled 2019-12-07 (×2): qty 1

## 2019-12-07 NOTE — Plan of Care (Signed)
  Problem: Education: Goal: Knowledge of General Education information will improve Description Including pain rating scale, medication(s)/side effects and non-pharmacologic comfort measures Outcome: Progressing   Problem: Clinical Measurements: Goal: Respiratory complications will improve Outcome: Progressing Goal: Cardiovascular complication will be avoided Outcome: Progressing   Problem: Activity: Goal: Risk for activity intolerance will decrease Outcome: Progressing   Problem: Pain Managment: Goal: General experience of comfort will improve Outcome: Progressing   Problem: Safety: Goal: Ability to remain free from injury will improve Outcome: Progressing   Problem: Skin Integrity: Goal: Risk for impaired skin integrity will decrease Outcome: Progressing   

## 2019-12-07 NOTE — Progress Notes (Signed)
PROGRESS NOTE   Terrance Watson  EYC:[id number]    DOB: [birth year]    DOA: 11/11/2019  PCP: Kandyce Rud, MD   I have briefly reviewed patients previous medical records in [facility].  Chief Complaint  Patient presents with  . 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  Reportedly treated for laryngitis with 2 rounds of antibiotics by PCP.  In the ED, in respiratory distress needed BiPAP.  Chest x-ray concerning for CHF.  CTA chest negative for PE, showed bilateral pleural effusion, multifocal airspace consolidation.  Admitted for acute systolic CHF.  Cardiology consulting.  Slowly improving.   Assessment & Plan:  Principal Problem:   Respiratory failure with hypoxia (HCC) Active Problems:   Diabetes mellitus without complication (HCC)   Elevated troponin   CKD (chronic kidney disease), stage III   Hyperkalemia   Acute CHF (congestive heart failure) (HCC)   Pneumonia    Acute systolic CHF: TTE showed LVEF of 35-40%.  Started on IV Lasix 40 mg twice daily.  -268 mL since admission but not sure if this is totally accurate.  Cardiology input appreciated, recommend continuing IV Lasix.  No ARB/ACEI/due to AKI.  Beta-blockers not initiated due to acute CHF.  Slowly improving.  Cardiology plans outpatient evaluation of low EF.  Multifocal pneumonia: Urine Legionella and pneumococcal antigen negative.  Sputum culture pending.  Blood cultures negative to date.  Empirically on IV ceftriaxone and azithromycin, day 3-continue.  Reports tenacious mucus and unable to bring up, will add Mucinex.  Will need follow-up imaging, chest x-ray were possibly CT chest in a couple of weeks to ensure resolution.  Acute respiratory failure with hypoxia: Due to decompensated CHF and pneumonia.   Not on home oxygen PTA.  Briefly on BiPAP on arrival.  Now transitioned to oxygen 4 L/min, continue to wean as tolerated.  Acute kidney injury complicating stage IIIa CKD: Baseline creatinine 1.3.  Now likely worsening due to diuresis.  Stable in the 1.5 range for the last 2 days.  Follow BMP in a.m.  Type II DM with hyperglycemia: Oral hypoglycemics held.  Reasonable inpatient control on SSI.  Elevated troponin: No anginal symptoms.  May be demand ischemia.  Cardiology plans ischemic work-up as outpatient.  Anemia: Possibly of chronic disease.  Stable.   Body mass index is 23.49 kg/m.   DVT prophylaxis: Lovenox Code Status: Full Family Communication: Discussed in detail with patient spouse at bedside, updated care and answered questions Disposition:  Status is: Inpatient  Remains inpatient appropriate because:IV treatments appropriate due to intensity of illness or inability to take PO   Dispo: The patient is from: Home              Anticipated d/c is to: Home              Anticipated d/c date is: 1 day              Patient currently is not medically stable to d/c.        Consultants:   Cardiology  Procedures:   None  Antimicrobials:   IV ceftriaxone   Subjective:  Overall feels much better.  Dyspnea significantly improved but not back to baseline.  Feels stronger.  Has cough and feels like sputum is stuck in his  chest and is unable to bring up.  Minimal phlegm with blood noted in tray at bedside.  No leg swelling.  Non-smoker.  Poor appetite.  Objective:   Vitals:   12/07/19 1152 12/07/19 1219 12/07/19 1511 12/07/19 1538  BP:  106/69  111/75  Pulse: (!) 104 (!) 101 (!) 101 99  Resp:  18  19  Temp:  98.1 F (36.7 C)  98.5 F (36.9 C)  TempSrc:  Oral  Oral  SpO2: 94% 91% 94% 94%  Weight:      Height:        General exam: Pleasant young male, moderately built and nourished sitting up comfortably in bed.  Appears slightly tachypneic at times with  speaking Respiratory system: Few fine bibasilar crackles but otherwise clear to auscultation without wheezing or rhonchi. Respiratory effort normal. Cardiovascular system: S1 & S2 heard, RRR.  JVD +.  No murmurs, gallops or clicks. No pedal edema.  Telemetry personally reviewed: Sinus tachycardia in the 100s. Gastrointestinal system: Abdomen is nondistended, soft and nontender. No organomegaly or masses felt. Normal bowel sounds heard. Central nervous system: Alert and oriented. No focal neurological deficits. Extremities: Symmetric 5 x 5 power. Skin: No rashes, lesions or ulcers Psychiatry: Judgement and insight appear normal. Mood & affect appropriate.     Data Reviewed:   I have personally reviewed following labs and imaging studies   CBC: Recent Labs  Lab 11/09/2019 0525 12/06/19 0043 12/07/19 0551  WBC 8.4 12.5* 10.4  NEUTROABS  --   --  9.4*  HGB 10.8* 10.5* 10.5*  HCT 33.1* 31.9* 31.4*  MCV 89.9 90.9 89.5  PLT 268 268 301    Basic Metabolic Panel: Recent Labs  Lab 12/04/2019 0525 12/06/19 0043 12/07/19 0551  NA 136 137 136  K 5.3* 4.4 4.3  CL 98 98 95*  CO2 26 26 25   GLUCOSE 237* 165* 187*  BUN 29* 34* 43*  CREATININE 1.62* 1.56* 1.58*  CALCIUM 8.5* 8.5* 8.7*  MG  --   --  2.1  PHOS  --   --  4.9*    Liver Function Tests: Recent Labs  Lab 11/11/2019 0525 12/07/19 0551  AST 27 20  ALT 26 23  ALKPHOS 85 80  BILITOT 1.1 1.2  PROT 6.3* 6.8  ALBUMIN 3.3* 3.6    CBG: Recent Labs  Lab 12/07/19 0729 12/07/19 1136 12/07/19 1632  GLUCAP 175* 182* 150*    Microbiology Studies:   Recent Results (from the past 240 hour(s))  SARS Coronavirus 2 by RT PCR (hospital order, performed in [facility] hospital lab) Nasopharyngeal Nasopharyngeal Swab     Status: None   Collection Time: 11/28/2019  5:25 AM   Specimen: Nasopharyngeal Swab  Result Value Ref Range Status   SARS Coronavirus 2 NEGATIVE NEGATIVE Final    Comment: (NOTE) SARS-CoV-2 target nucleic acids  are NOT DETECTED.  The SARS-CoV-2 RNA is generally detectable in upper and lower respiratory specimens during the acute phase of infection. The lowest concentration of SARS-CoV-2 viral copies this assay can detect is 250 copies / mL. A negative result does not preclude SARS-CoV-2 infection and should not be used as the sole basis for treatment or other patient management decisions.  A negative result may occur with improper specimen collection / handling, submission of specimen other than nasopharyngeal swab, presence of viral mutation(s) within the areas targeted by this assay, and inadequate number of viral copies (<250 copies / mL). A negative result must be combined with clinical observations,  patient history, and epidemiological information.  Fact Sheet for Patients:   BoilerBrush.com.cy  Fact Sheet for Healthcare Providers: https://pope.com/  This test is not yet approved or  cleared by the [place] FDA and has been authorized for detection and/or diagnosis of SARS-CoV-2 by FDA under an Emergency Use Authorization (EUA).  This EUA will remain in effect (meaning this test can be used) for the duration of the COVID-19 declaration under Section 564(b)(1) of the Act, 21 U.S.C. section 360bbb-3(b)(1), unless the authorization is terminated or revoked sooner.  Performed at [facility], [address] [place], [place] [address]   Blood culture (routine x 2)     Status: None (Preliminary result)   Collection Time: 01-04-2020  5:54 AM   Specimen: BLOOD  Result Value Ref Range Status   Specimen Description BLOOD LEFT AC  Final   Special Requests   Final    BOTTLES DRAWN AEROBIC AND ANAEROBIC Blood Culture results may not be optimal due to an excessive volume of blood received in culture bottles   Culture   Final    NO GROWTH 2 DAYS Performed at [facility], [address], [place], [place] [address]     Report Status PENDING  Incomplete  Blood culture (routine x 2)     Status: None (Preliminary result)   Collection Time: 01-04-2020  6:13 AM   Specimen: BLOOD  Result Value Ref Range Status   Specimen Description BLOOD RIGHT HAND  Final   Special Requests   Final    BOTTLES DRAWN AEROBIC AND ANAEROBIC Blood Culture results may not be optimal due to an inadequate volume of blood received in culture bottles   Culture   Final    NO GROWTH 2 DAYS Performed at [facility], [address], [place], [place] [address]    Report Status PENDING  Incomplete  Expectorated sputum assessment w rflx to resp cult     Status: None   Collection Time: 12/06/19  4:23 PM   Specimen: Sputum  Result Value Ref Range Status   Specimen Description SPUTUM  Final   Special Requests NONE  Final   Sputum evaluation   Final    Sputum specimen not acceptable for testing.  Please recollect.   RESULT CALLED TO, READ BACK BY AND VERIFIED WITH: Hyman Hopes 12/06/19 1712 KLW Performed at [facility], [address] [place], [place] [address]    Report Status 12/06/2019 FINAL  Final  Expectorated sputum assessment w rflx to resp cult     Status: None   Collection Time: 12/06/19 10:00 PM   Specimen: SPU  Result Value Ref Range Status   Specimen Description SPUTUM  Final   Special Requests NONE  Final   Sputum evaluation   Final    THIS SPECIMEN IS ACCEPTABLE FOR SPUTUM CULTURE Performed at [facility], [address], [place], [place] [address]    Report Status 12/07/2019 FINAL  Final     Radiology Studies:  No results found.   Scheduled Meds:   . azithromycin  500 mg Oral Daily  . benzonatate  100 mg Oral TID  . enoxaparin (LOVENOX) injection  40 mg Subcutaneous Q24H  . fluticasone  2 spray Each Nare Daily  . furosemide  40 mg Intravenous BID  . insulin aspart  0-5 Units Subcutaneous QHS  . insulin aspart  0-9 Units Subcutaneous TID WC  . multivitamin with minerals  1  tablet Oral Daily  . pantoprazole  40  mg Oral Daily  . saccharomyces boulardii  250 mg Oral q morning - 10a  . sodium chloride flush  3 mL Intravenous Q12H    Continuous Infusions:   . sodium chloride    . cefTRIAXone (ROCEPHIN)  IV 2 g (12/07/19 0820)     LOS: 2 days     Marcellus Scott, MD, [place], [facility]. [facility]    To contact the attending provider between 7A-7P or the covering provider during after hours 7P-7A, please log into the web site [email or web address] and access using universal [place] password for that web site. If you do not have the password, please call the hospital operator.  12/07/2019, 5:16 PM

## 2019-12-07 NOTE — Plan of Care (Signed)
VSS throughout shift. Pt titrated down to 2L nasal cannula, tolerating well. Pt tolerated oral intake. Pt ambulated in room. PRN medication as needed. No Falls. Problem: Education: Goal: Knowledge of General Education information will improve Description: Including pain rating scale, medication(s)/side effects and non-pharmacologic comfort measures Outcome: Progressing   Problem: Clinical Measurements: Goal: Ability to maintain clinical measurements within normal limits will improve Outcome: Progressing Goal: Respiratory complications will improve Outcome: Progressing   Problem: Activity: Goal: Risk for activity intolerance will decrease Outcome: Progressing   Problem: Nutrition: Goal: Adequate nutrition will be maintained Outcome: Progressing   Problem: Coping: Goal: Level of anxiety will decrease Outcome: Progressing   Problem: Elimination: Goal: Will not experience complications related to urinary retention Outcome: Not Progressing   Problem: Pain Managment: Goal: General experience of comfort will improve Outcome: Not Progressing

## 2019-12-07 NOTE — Progress Notes (Signed)
[facility] Cardiology    SUBJECTIVE: The patient has been having coughing spells for the last 2 weeks. Last night, he also had dry heaves with the coughing spells, and then feels worn out. He denies chest pain.    Vitals:   12/06/19 1927 12/06/19 2000 12/07/19 0500 12/07/19 0729  BP: 114/67  114/75 110/65  Pulse: 97  100 (!) 105  Resp:  (!) 21  19  Temp: 98.3 F (36.8 C)  98.5 F (36.9 C) 97.8 F (36.6 C)  TempSrc: Oral     SpO2: 95%  96% 93%  Weight:   70.1 kg   Height:         Intake/Output Summary (Last 24 hours) at 12/07/2019 4827 Last data filed at 12/07/2019 0786 Gross per 24 hour  Intake 720 ml  Output 1100 ml  Net -380 ml      PHYSICAL EXAM  General: Ill-appearing, sitting up in bed, appears tired, in no acute distress HEENT:  Normocephalic and atramatic Neck:  No JVD.  Lungs: Diminished bibasilar breath sounds, no wheezing or crackles Heart: tachycardic, regular rhythm. Normal S1 and S2 without gallops or murmurs.  Abdomen: nondistended Msk:  Back normal, gait not assessed. No obvious deformities. Extremities: No clubbing, cyanosis or edema.   Neuro: Alert and oriented X 3. Psych:  Good affect, responds appropriately   LABS: Basic Metabolic Panel: Recent Labs    12/06/19 0043 12/07/19 0551  NA 137 136  K 4.4 4.3  CL 98 95*  CO2 26 25  GLUCOSE 165* 187*  BUN 34* 43*  CREATININE 1.56* 1.58*  CALCIUM 8.5* 8.7*  MG  --  2.1  PHOS  --  4.9*   Liver Function Tests: Recent Labs    17-Dec-2019 0525 12/07/19 0551  AST 27 20  ALT 26 23  ALKPHOS 85 80  BILITOT 1.1 1.2  PROT 6.3* 6.8  ALBUMIN 3.3* 3.6   No results for input(s): LIPASE, AMYLASE in the last 72 hours. CBC: Recent Labs    12/06/19 0043 12/07/19 0551  WBC 12.5* 10.4  NEUTROABS  --  9.4*  HGB 10.5* 10.5*  HCT 31.9* 31.4*  MCV 90.9 89.5  PLT 268 301   Cardiac Enzymes: No results for input(s): CKTOTAL, CKMB, CKMBINDEX, TROPONINI in the last 72 hours. BNP: Invalid input(s):  POCBNP D-Dimer: No results for input(s): DDIMER in the last 72 hours. Hemoglobin A1C: Recent Labs    Dec 17, 2019 0849  HGBA1C 7.2*   Fasting Lipid Panel: No results for input(s): CHOL, HDL, LDLCALC, TRIG, CHOLHDL, LDLDIRECT in the last 72 hours. Thyroid Function Tests: No results for input(s): TSH, T4TOTAL, T3FREE, THYROIDAB in the last 72 hours.  Invalid input(s): FREET3 Anemia Panel: No results for input(s): VITAMINB12, FOLATE, FERRITIN, TIBC, IRON, RETICCTPCT in the last 72 hours.  ECHOCARDIOGRAM COMPLETE  Result Date: December 17, 2019    ECHOCARDIOGRAM REPORT   Patient Name:   Terrance Watson Date of Exam: 2019-12-17 Medical Rec #:  [id number]      Height:       68.0 in Accession #:    [id number]     Weight:       130.0 lb Date of Birth:  05-11-1956      BSA:          1.702 m Patient Age:    64 years       BP:           95/75 mmHg Patient Gender: M  HR:           94 bpm. Exam Location:  [facility] Procedure: 2D Echo, Color Doppler and Cardiac Doppler Indications:     I50.9 Congestive Heart Failure  History:         Patient has no prior history of Echocardiogram examinations.                  Risk Factors:Hypertension and Diabetes.  Sonographer:     Humphrey Rolls RDCS (AE) Referring Phys:  [id number] Lucile Shutters Diagnosing Phys: Marcina Millard MD  Sonographer Comments: Suboptimal parasternal window. IMPRESSIONS  1. Left ventricular ejection fraction, by estimation, is 35 to 40%. The left ventricle has moderately decreased function. The left ventricle has no regional wall motion abnormalities. Left ventricular diastolic parameters were normal.  2. Right ventricular systolic function is normal. The right ventricular size is normal.  3. The mitral valve is normal in structure. Moderate mitral valve regurgitation. No evidence of mitral stenosis.  4. Tricuspid valve regurgitation is mild to moderate.  5. The aortic valve is normal in structure. Aortic valve regurgitation is not visualized. No aortic  stenosis is present.  6. The inferior vena cava is normal in size with greater than 50% respiratory variability, suggesting right atrial pressure of 3 mmHg. FINDINGS  Left Ventricle: Left ventricular ejection fraction, by estimation, is 35 to 40%. The left ventricle has moderately decreased function. The left ventricle has no regional wall motion abnormalities. The left ventricular internal cavity size was normal in size. There is no left ventricular hypertrophy. Left ventricular diastolic parameters were normal. Right Ventricle: The right ventricular size is normal. No increase in right ventricular wall thickness. Right ventricular systolic function is normal. Left Atrium: Left atrial size was normal in size. Right Atrium: Right atrial size was normal in size. Pericardium: There is no evidence of pericardial effusion. Mitral Valve: The mitral valve is normal in structure. Normal mobility of the mitral valve leaflets. Moderate mitral valve regurgitation. No evidence of mitral valve stenosis. MV peak gradient, 6.7 mmHg. The mean mitral valve gradient is 3.0 mmHg. Tricuspid Valve: The tricuspid valve is normal in structure. Tricuspid valve regurgitation is mild to moderate. No evidence of tricuspid stenosis. Aortic Valve: The aortic valve is normal in structure. Aortic valve regurgitation is not visualized. No aortic stenosis is present. Aortic valve mean gradient measures 2.0 mmHg. Aortic valve peak gradient measures 3.4 mmHg. Aortic valve area, by VTI measures 2.26 cm. Pulmonic Valve: The pulmonic valve was normal in structure. Pulmonic valve regurgitation is not visualized. No evidence of pulmonic stenosis. Aorta: The aortic root is normal in size and structure. Venous: The inferior vena cava is normal in size with greater than 50% respiratory variability, suggesting right atrial pressure of 3 mmHg. IAS/Shunts: No atrial level shunt detected by color flow Doppler.  LEFT VENTRICLE PLAX 2D LVIDd:         4.43 cm   Diastology LVIDs:         3.58 cm  LV e' lateral:   7.29 cm/s LV PW:         0.79 cm  LV E/e' lateral: 15.9 LV IVS:        0.84 cm  LV e' medial:    6.20 cm/s LVOT diam:     1.90 cm  LV E/e' medial:  18.7 LV SV:         33 LV SV Index:   19 LVOT Area:     2.84 cm  RIGHT VENTRICLE  RV Basal diam:  2.77 cm LEFT ATRIUM             Index       RIGHT ATRIUM           Index LA diam:        3.90 cm 2.29 cm/m  RA Area:     13.50 cm LA Vol (A2C):   62.1 ml 36.49 ml/m RA Volume:   31.10 ml  18.27 ml/m LA Vol (A4C):   51.5 ml 30.26 ml/m LA Biplane Vol: 63.1 ml 37.08 ml/m  AORTIC VALVE                   PULMONIC VALVE AV Area (Vmax):    2.08 cm    PV Vmax:       0.71 m/s AV Area (Vmean):   1.85 cm    PV Vmean:      48.100 cm/s AV Area (VTI):     2.26 cm    PV VTI:        0.097 m AV Vmax:           92.40 cm/s  PV Peak grad:  2.0 mmHg AV Vmean:          68.700 cm/s PV Mean grad:  1.0 mmHg AV VTI:            0.144 m AV Peak Grad:      3.4 mmHg AV Mean Grad:      2.0 mmHg LVOT Vmax:         67.90 cm/s LVOT Vmean:        44.800 cm/s LVOT VTI:          0.115 m LVOT/AV VTI ratio: 0.80  AORTA Ao Root diam: 2.80 cm MITRAL VALVE MV Area (PHT): 8.43 cm     SHUNTS MV Peak grad:  6.7 mmHg     Systemic VTI:  0.12 m MV Mean grad:  3.0 mmHg     Systemic Diam: 1.90 cm MV Vmax:       1.29 m/s MV Vmean:      73.6 cm/s MV Decel Time: 90 msec MV E velocity: 116.00 cm/s MV A velocity: 75.40 cm/s MV E/A ratio:  1.54 Marcina Millard MD Electronically signed by Marcina Millard MD Signature Date/Time: 11/29/2019/4:52:16 PM    Final      Echo LVEF 35-40% with no wall motion abnormalities  TELEMETRY: sinus rhythm  ASSESSMENT AND PLAN:  Principal Problem:   Respiratory failure with hypoxia (HCC) Active Problems:   Diabetes mellitus without complication (HCC)   Elevated troponin   CKD (chronic kidney disease), stage III   Hyperkalemia   Acute CHF (congestive heart failure) (HCC)   Pneumonia   1. Acute hypoxic respiratory  failure, likely secondary to multifocal pneumonia and acute systolic CHF. Chest CT negative for PE, with moderate free-flowing pleural effusions bilaterally with multifocal airspace consolidation, most severe in the lower lobe bilaterally, suspect multifocal pneumonia. Atypical pneumonia panel pending. BNP elevated to 718. Lactic acid 2.2. The patient felt better yesterday, but was not able to sleep last night due to coughing spells and dry heaves. 2. Acute systolic CHF, with LVEF 35-40%, with no echocardiograms for comparison. On IV Lasix. Patient denies chest pain. Repeat ECG without ischemic changes. BNP 718 >>1073. Weight has been stable. 3. Elevated troponin (155, 144, 129, 154, 203, 218, 189, 181),  likely secondary to demand supply ischemia in the setting of acute hypoxic respiratory failure with suspected pneumonia  and acute systolic CHF. ECG with T wave inversions V5, V6 without ST elevation. Repeat ECG without ischemic changes. 4. Acute kidney injury, creatinine 1.58, BUN 43  Recommendations: 1. Continue IV Lasix 40 mg BID with careful monitoring of renal status 2. Continue antibiotics and antitussives per hospitalist 3. Plan to discharge on beta blocker, Lasix, ACEi/ARB 4. Will continue outpatient evaluation of reduced EF once pneumonia has resolved.   Leanora Ivanoff, PA-C 12/07/2019 8:42 AM    Discussed with DR. Paraschos who agrees with the above plan.

## 2019-12-08 ENCOUNTER — Inpatient Hospital Stay: Payer: BC Managed Care – PPO

## 2019-12-08 DIAGNOSIS — N170 Acute kidney failure with tubular necrosis: Secondary | ICD-10-CM

## 2019-12-08 DIAGNOSIS — N183 Chronic kidney disease, stage 3 unspecified: Secondary | ICD-10-CM

## 2019-12-08 DIAGNOSIS — J9601 Acute respiratory failure with hypoxia: Secondary | ICD-10-CM

## 2019-12-08 DIAGNOSIS — I5021 Acute systolic (congestive) heart failure: Secondary | ICD-10-CM

## 2019-12-08 LAB — PROTEIN / CREATININE RATIO, URINE
Creatinine, Urine: 168 mg/dL
Protein Creatinine Ratio: 0.22 mg/mg{Cre} — ABNORMAL HIGH (ref 0.00–0.15)
Total Protein, Urine: 37 mg/dL

## 2019-12-08 LAB — COMPREHENSIVE METABOLIC PANEL
ALT: 22 U/L (ref 0–44)
AST: 21 U/L (ref 15–41)
Albumin: 3.4 g/dL — ABNORMAL LOW (ref 3.5–5.0)
Alkaline Phosphatase: 70 U/L (ref 38–126)
Anion gap: 13 (ref 5–15)
BUN: 51 mg/dL — ABNORMAL HIGH (ref 8–23)
CO2: 27 mmol/L (ref 22–32)
Calcium: 8.5 mg/dL — ABNORMAL LOW (ref 8.9–10.3)
Chloride: 95 mmol/L — ABNORMAL LOW (ref 98–111)
Creatinine, Ser: 1.67 mg/dL — ABNORMAL HIGH (ref 0.61–1.24)
GFR calc Af Amer: 50 mL/min — ABNORMAL LOW (ref >60)
GFR calc non Af Amer: 43 mL/min — ABNORMAL LOW (ref >60)
Glucose, Bld: 177 mg/dL — ABNORMAL HIGH (ref 70–99)
Potassium: 3.9 mmol/L (ref 3.5–5.1)
Sodium: 135 mmol/L (ref 135–145)
Total Bilirubin: 1.1 mg/dL (ref 0.3–1.2)
Total Protein: 6.3 g/dL — ABNORMAL LOW (ref 6.5–8.1)

## 2019-12-08 LAB — URINALYSIS, MICROSCOPIC (REFLEX)
Bacteria, UA: NONE SEEN
RBC / HPF: NONE SEEN RBC/hpf (ref 0–5)
WBC, UA: NONE SEEN WBC/hpf (ref 0–5)

## 2019-12-08 LAB — HEPATITIS B SURFACE ANTIBODY,QUALITATIVE: Hep B S Ab: NONREACTIVE

## 2019-12-08 LAB — GLUCOSE, CAPILLARY
Glucose-Capillary: 190 mg/dL — ABNORMAL HIGH (ref 70–99)
Glucose-Capillary: 209 mg/dL — ABNORMAL HIGH (ref 70–99)
Glucose-Capillary: 194 mg/dL — ABNORMAL HIGH (ref 70–99)

## 2019-12-08 LAB — URINALYSIS, ROUTINE W REFLEX MICROSCOPIC
Glucose, UA: NEGATIVE mg/dL
Hgb urine dipstick: NEGATIVE
Ketones, ur: 15 mg/dL — AB
Leukocytes,Ua: NEGATIVE
Nitrite: NEGATIVE
Protein, ur: 30 mg/dL — AB
Specific Gravity, Urine: 1.025 (ref 1.005–1.030)
pH: 5.5 (ref 5.0–8.0)

## 2019-12-08 LAB — HEPATITIS B CORE ANTIBODY, IGM: Hep B C IgM: NONREACTIVE

## 2019-12-08 LAB — HEPATITIS C ANTIBODY: HCV Ab: NONREACTIVE

## 2019-12-08 LAB — IRON AND TIBC
Iron: 25 ug/dL — ABNORMAL LOW (ref 45–182)
Saturation Ratios: 8 % — ABNORMAL LOW (ref 17.9–39.5)
TIBC: 316 ug/dL (ref 250–450)
UIBC: 291 ug/dL

## 2019-12-08 LAB — FERRITIN: Ferritin: 236 ng/mL (ref 24–336)

## 2019-12-08 LAB — VITAMIN B12: Vitamin B-12: 905 pg/mL (ref 180–914)

## 2019-12-08 LAB — HEPATITIS B SURFACE ANTIGEN: Hepatitis B Surface Ag: NONREACTIVE

## 2019-12-08 LAB — FOLATE: Folate: 19 ng/mL (ref >5.9)

## 2019-12-08 MED ORDER — LEVALBUTEROL HCL 1.25 MG/0.5ML IN NEBU
1.2500 mg | INHALATION_SOLUTION | Freq: Three times a day (TID) | RESPIRATORY_TRACT | Status: DC
  Administered 2019-12-08 – 2019-12-09 (×3): 1.25 mg via RESPIRATORY_TRACT
  Filled 2019-12-08 (×7): qty 0.5

## 2019-12-08 MED ORDER — BUDESONIDE 0.25 MG/2ML IN SUSP
0.2500 mg | Freq: Two times a day (BID) | RESPIRATORY_TRACT | Status: DC
  Administered 2019-12-08 – 2019-12-09 (×3): 0.25 mg via RESPIRATORY_TRACT
  Filled 2019-12-08 (×3): qty 2

## 2019-12-08 NOTE — Progress Notes (Signed)
[facility]    SUBJECTIVE: The patient reports feeling somewhat better this morning. He denies chest pain. He has shortness of breath with minimal ambulation. He continues to have a significant minimally-productive cough with dry heaving that tires him.   Vitals:   12/07/19 1538 12/07/19 1920 12/08/19 0423 12/08/19 0743  BP: 111/75 106/72 122/81 114/77  Pulse: 99 (!) 101 (!) 102 100  Resp: 19 16 16 18   Temp: 98.5 F (36.9 C) 98.2 F (36.8 C) 98.9 F (37.2 C) 97.9 F (36.6 C)  TempSrc: Oral Oral Oral   SpO2: 94% 90% 90% 94%  Weight:   69.8 kg   Height:         Intake/Output Summary (Last 24 hours) at 12/08/2019 0845 Last data filed at 12/07/2019 2302 Gross per 24 hour  Intake 240 ml  Output 700 ml  Net -460 ml      PHYSICAL EXAM  General: Well developed, well nourished, in no acute distress, sitting up in bed HEENT:  Normocephalic and atramatic Neck:  No JVD.  Lungs: slight increased work of breathing on supplemental oxygen; not able to speak in full sentences. Diminished bibasilar breath sounds Heart: HRRR . Normal S1 and S2 without gallops or murmurs.  Abdomen: soft, slightly distended Msk:  Back normal, gait not assessed.  Extremities: No clubbing, cyanosis or edema.   Neuro: Alert and oriented X 3. Psych:  Good affect, responds appropriately   LABS: Basic Metabolic Panel: Recent Labs    12/07/19 0551 12/08/19 0410  NA 136 135  K 4.3 3.9  CL 95* 95*  CO2 25 27  GLUCOSE 187* 177*  BUN 43* 51*  CREATININE 1.58* 1.67*  CALCIUM 8.7* 8.5*  MG 2.1  --   PHOS 4.9*  --    Liver Function Tests: Recent Labs    12/07/19 0551 12/08/19 0410  AST 20 21  ALT 23 22  ALKPHOS 80 70  BILITOT 1.2 1.1  PROT 6.8 6.3*  ALBUMIN 3.6 3.4*   No results for input(s): LIPASE, AMYLASE in the last 72 hours. CBC: Recent Labs    12/06/19 0043 12/07/19 0551  WBC 12.5* 10.4  NEUTROABS  --  9.4*  HGB 10.5* 10.5*  HCT 31.9* 31.4*  MCV 90.9 89.5  PLT 268 301    Cardiac Enzymes: No results for input(s): CKTOTAL, CKMB, CKMBINDEX, TROPONINI in the last 72 hours. BNP: Invalid input(s): POCBNP D-Dimer: No results for input(s): DDIMER in the last 72 hours. Hemoglobin A1C: Recent Labs    12-20-19 0849  HGBA1C 7.2*   Fasting Lipid Panel: No results for input(s): CHOL, HDL, LDLCALC, TRIG, CHOLHDL, LDLDIRECT in the last 72 hours. Thyroid Function Tests: No results for input(s): TSH, T4TOTAL, T3FREE, THYROIDAB in the last 72 hours.  Invalid input(s): FREET3 Anemia Panel: No results for input(s): VITAMINB12, FOLATE, FERRITIN, TIBC, IRON, RETICCTPCT in the last 72 hours.  No results found.   Echo LVEF 35-40% with no wall motion abnormalities  TELEMETRY: sinus rhythm  ASSESSMENT AND PLAN:  Principal Problem:   Respiratory failure with hypoxia (HCC) Active Problems:   Diabetes mellitus without complication (HCC)   Elevated troponin   CKD (chronic kidney disease), stage III   Hyperkalemia   Acute CHF (congestive heart failure) (HCC)   Pneumonia    1. Acute hypoxic respiratory failure, with suspected multifocal pneumoniaand acute systolic CHF. Chest CT negative for PE, with moderate free-flowing pleural effusions bilaterally with multifocal airspace consolidation, most severe in the lower lobe bilaterally, suspect multifocal pneumonia.  Atypical pneumonia panel and sputum culture negative. Normal WBC.  The patient has been having coughing spells and dry heaves with minimal clear, sometimes blood-tinged sputum. He reports feeling somewhat better, still hypoxic with minimal exertion. Seems refractory on IV Lasix. 2. Acute systolic CHF, with LVEF 35-40%, with no echocardiograms for comparison. On IV Lasix. Patient denies chest pain. Repeat ECG without ischemic changes. BNP 718 >>1073. Weight has been stable. 3. Elevated troponin (155, 144, 129, 154, 203, 218, 189, 181), likely secondary to demand supply ischemia in the setting of acute hypoxic  respiratory failure with suspected pneumonia and acute systolic CHF. ECG with T wave inversions V5, V6 without ST elevation. Repeat ECG without ischemic changes. 4. Acute kidney injury, worsening secondary to diuretics  Recommendations: 1. Consult Dr. Belia Heman, and possible bilateral thoracentesis 2. Lasix currently being held due to AKI 3. Plan to discharge on beta blocker, Lasix, ACEi/ARB, and diuretic  4. Will continue outpatient evaluation of reduced EF once pneumoniahasresolved. 5. Further cardiology care during this admission will be with Dr. Dorothyann Peng.  Leanora Ivanoff, PA-C 12/08/2019 8:45 AM

## 2019-12-08 NOTE — Progress Notes (Signed)
PROGRESS NOTE   Terrance Watson  YOV:[id number]    DOB: 1955/10/27    DOA: 11/20/2019  PCP: Kandyce Rud, MD   I have briefly reviewed patients previous medical records in [facility].  Chief Complaint  Patient presents with  . 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  Reportedly treated for laryngitis with 2 rounds of antibiotics by PCP.  In the ED, in respiratory distress needed BiPAP.  Chest x-ray concerning for CHF.  CTA chest negative for PE, showed bilateral pleural effusion, multifocal airspace consolidation.  Admitted for acute systolic CHF. Cardiology, pulmonology and nephrology consulted.   Assessment & Plan:  Principal Problem:   Respiratory failure with hypoxia (HCC) Active Problems:   Diabetes mellitus without complication (HCC)   Elevated troponin   CKD (chronic kidney disease), stage III   Hyperkalemia   Acute CHF (congestive heart failure) (HCC)   Pneumonia    Acute systolic CHF: TTE showed LVEF of 35-40%.  Started on IV Lasix 40 mg twice daily.  -268 mL since admission but not sure if this is totally accurate.  Cardiology input appreciated, recommend continuing IV Lasix.  No ARB/ACEI/due to AKI.  Beta-blockers not initiated due to acute CHF. Creatinine worsened to 1.7 from 1.5. IV Lasix discontinued. Discussed in detail with cardiology, have requested pulmonology for thoracentesis. Will evaluate outpatient for ischemia work-up.? Viral myocarditis.  Multifocal pneumonia: Urine Legionella and pneumococcal antigen negative.  Sputum culture pending.  Blood cultures negative to date.  Empirically on IV ceftriaxone and azithromycin, day 4-continue.  Reports tenacious mucus and unable to bring up, will add Mucinex.  Will need follow-up imaging, chest x-ray  were possibly CT chest in a couple of weeks to ensure resolution. Pulmonary consultation appreciated. Discussed with Dr. Jayme Cloud at bedside. Suspect atypical pneumonia-okay to discontinue antibiotics. Imaging findings suggestive of pulmonary edema, diuresis limited due to acute kidney injury and wondering if needs inotropic support for diuresis.  Acute respiratory failure with hypoxia: Due to decompensated CHF and pneumonia.  Not on home oxygen PTA.  Briefly on BiPAP on arrival.  Now transitioned to oxygen 4 L/min, continue to wean as tolerated. Pulmonary input appreciated.  Acute kidney injury complicating stage IIIa CKD: Baseline creatinine 1.3.  Now likely worsening due to diuresis.  Stable in the 1.5 range for the last 2 days. Creatinine up to 1.7. Lasix discontinued. Suspecting cardiorenal syndrome. CKD work-up ongoing follow BMP in a.m.  Type II DM with hyperglycemia: Oral hypoglycemics held.  Reasonable inpatient control on SSI.  Elevated troponin: No anginal symptoms.  May be demand ischemia.  Cardiology plans ischemic work-up as outpatient.  Anemia: Possibly of chronic disease.  Stable.   Body mass index is 23.41 kg/m.   DVT prophylaxis: Lovenox Code Status: Full Family Communication: Discussed in detail with patient spouse at bedside, updated care and answered questions Disposition:  Status is: Inpatient  Remains inpatient appropriate because:IV treatments appropriate due to intensity of illness or inability to take PO   Dispo: The patient is from: Home              Anticipated d/c is to: Home              Anticipated d/c date is: 1 day  Patient currently is not medically stable to d/c.        Consultants:   Cardiology  Procedures:   None  Antimicrobials:   IV ceftriaxone   Subjective:  Dyspnea improved but not completely resolved. Ongoing cough with minimal bloodstained sputum. Evaluated with pulmonology at bedside.  Objective:   Vitals:    12/08/19 0423 12/08/19 0743 12/08/19 1300 12/08/19 1644  BP: 122/81 114/77  126/81  Pulse: (!) 102 100  (!) 107  Resp: 16 18  18   Temp: 98.9 F (37.2 C) 97.9 F (36.6 C)  98.4 F (36.9 C)  TempSrc: Oral     SpO2: 90% 94% 93% 93%  Weight: 69.8 kg     Height:        General exam: Pleasant young male, moderately built and nourished sitting up comfortably in bed. Does not appear in any distress. Respiratory system: Few fine bibasilar crackles but otherwise clear to auscultation without wheezing or rhonchi. Respiratory effort normal. Cardiovascular system: S1 & S2 heard, RRR.  JVD +.  No murmurs, gallops or clicks. No pedal edema.  Gastrointestinal system: Abdomen is nondistended, soft and nontender. No organomegaly or masses felt. Normal bowel sounds heard. Central nervous system: Alert and oriented. No focal neurological deficits. Extremities: Symmetric 5 x 5 power. Skin: No rashes, lesions or ulcers Psychiatry: Judgement and insight appear normal. Mood & affect appropriate.     Data Reviewed:   I have personally reviewed following labs and imaging studies   CBC: Recent Labs  Lab 11/22/2019 0525 12/06/19 0043 12/07/19 0551  WBC 8.4 12.5* 10.4  NEUTROABS  --   --  9.4*  HGB 10.8* 10.5* 10.5*  HCT 33.1* 31.9* 31.4*  MCV 89.9 90.9 89.5  PLT 268 268 301    Basic Metabolic Panel: Recent Labs  Lab 12/06/19 0043 12/07/19 0551 12/08/19 0410  NA 137 136 135  K 4.4 4.3 3.9  CL 98 95* 95*  CO2 26 25 27   GLUCOSE 165* 187* 177*  BUN 34* 43* 51*  CREATININE 1.56* 1.58* 1.67*  CALCIUM 8.5* 8.7* 8.5*  MG  --  2.1  --   PHOS  --  4.9*  --     Liver Function Tests: Recent Labs  Lab 11/26/2019 0525 12/07/19 0551 12/08/19 0410  AST 27 20 21   ALT 26 23 22   ALKPHOS 85 80 70  BILITOT 1.1 1.2 1.1  PROT 6.3* 6.8 6.3*  ALBUMIN 3.3* 3.6 3.4*    CBG: Recent Labs  Lab 12/07/19 2116 12/08/19 0741 12/08/19 1643  GLUCAP 164* 190* 209*    Microbiology Studies:   Recent  Results (from the past 240 hour(s))  SARS Coronavirus 2 by RT PCR (hospital order, performed in [facility] hospital lab) Nasopharyngeal Nasopharyngeal Swab     Status: None   Collection Time: 12/07/2019  5:25 AM   Specimen: Nasopharyngeal Swab  Result Value Ref Range Status   SARS Coronavirus 2 NEGATIVE NEGATIVE Final    Comment: (NOTE) SARS-CoV-2 target nucleic acids are NOT DETECTED.  The SARS-CoV-2 RNA is generally detectable in upper and lower respiratory specimens during the acute phase of infection. The lowest concentration of SARS-CoV-2 viral copies this assay can detect is 250 copies / mL. A negative result does not preclude SARS-CoV-2 infection and should not be used as the sole basis for treatment or other patient management decisions.  A negative result may occur with improper specimen collection / handling, submission of specimen other than nasopharyngeal swab, presence of viral mutation(s)  within the areas targeted by this assay, and inadequate number of viral copies (<250 copies / mL). A negative result must be combined with clinical observations, patient history, and epidemiological information.  Fact Sheet for Patients:   BoilerBrush.com.cy  Fact Sheet for Healthcare Providers: https://pope.com/  This test is not yet approved or  cleared by the [place] FDA and has been authorized for detection and/or diagnosis of SARS-CoV-2 by FDA under an Emergency Use Authorization (EUA).  This EUA will remain in effect (meaning this test can be used) for the duration of the COVID-19 declaration under Section 564(b)(1) of the Act, 21 U.S.C. section 360bbb-3(b)(1), unless the authorization is terminated or revoked sooner.  Performed at [facility] [address] [place], [place] [address]   Blood culture (routine x 2)     Status: None (Preliminary result)   Collection Time: 01-01-2020  5:54 AM   Specimen: BLOOD    Result Value Ref Range Status   Specimen Description BLOOD LEFT AC  Final   Special Requests   Final    BOTTLES DRAWN AEROBIC AND ANAEROBIC Blood Culture results may not be optimal due to an excessive volume of blood received in culture bottles   Culture   Final    NO GROWTH 3 DAYS Performed at [facility], [address], [place], [place] [address]    Report Status PENDING  Incomplete  Blood culture (routine x 2)     Status: None (Preliminary result)   Collection Time: January 01, 2020  6:13 AM   Specimen: BLOOD  Result Value Ref Range Status   Specimen Description BLOOD RIGHT HAND  Final   Special Requests   Final    BOTTLES DRAWN AEROBIC AND ANAEROBIC Blood Culture results may not be optimal due to an inadequate volume of blood received in culture bottles   Culture   Final    NO GROWTH 3 DAYS Performed at [facility], [address], [place], [place] [address]    Report Status PENDING  Incomplete  Expectorated sputum assessment w rflx to resp cult     Status: None   Collection Time: 12/06/19  4:23 PM   Specimen: Sputum  Result Value Ref Range Status   Specimen Description SPUTUM  Final   Special Requests NONE  Final   Sputum evaluation   Final    Sputum specimen not acceptable for testing.  Please recollect.   RESULT CALLED TO, READ BACK BY AND VERIFIED WITH: Hyman Hopes 12/06/19 1712 KLW Performed at [facility], [address] [place], [place] [address]    Report Status 12/06/2019 FINAL  Final  Expectorated sputum assessment w rflx to resp cult     Status: None   Collection Time: 12/06/19 10:00 PM   Specimen: SPU  Result Value Ref Range Status   Specimen Description SPUTUM  Final   Special Requests NONE  Final   Sputum evaluation   Final    THIS SPECIMEN IS ACCEPTABLE FOR SPUTUM CULTURE Performed at [facility], [address], [place], [place] [address]    Report Status 12/07/2019 FINAL  Final  Culture, respiratory     Status:  None (Preliminary result)   Collection Time: 12/06/19 10:00 PM   Specimen: SPU  Result Value Ref Range Status   Specimen Description   Final    SPUTUM Performed at [facility], [address], [place], [place] [address]    Special Requests   Final    NONE Reflexed from  [id number] Performed at [facility], [address] [place], [place] [address]    Gram Stain   Final    RARE WBC PRESENT, PREDOMINANTLY MONONUCLEAR NO ORGANISMS SEEN    Culture   Final    NO GROWTH < 24 HOURS Performed at [facility], [address], [place], [place] [address]    Report Status PENDING  Incomplete     Radiology Studies:  DG Chest 2 View  Result Date: 12/08/2019 CLINICAL DATA:  Cough and congestion EXAM: CHEST - 2 VIEW COMPARISON:  12-29-2019 FINDINGS: Cardiac shadow is stable. Patchy increasing infiltrates are noted particularly in the right upper lobe. Increasing bilateral effusions are noted. Central vascular congestion is again seen with interstitial edema. IMPRESSION: Changes of CHF stable from the prior exam. Increasing effusions are noted as well as patchy infiltrates bilaterally particularly in the right upper lobe. Electronically Signed   By: Alcide Clever M.D.   On: 12/08/2019 13:55   US RENAL  Result Date: 12/08/2019 CLINICAL DATA:  Chronic renal disease. Associated acute renal disease. EXAM: RENAL / URINARY TRACT ULTRASOUND COMPLETE COMPARISON:  CT chest 12/29/2019. FINDINGS: Right Kidney: Renal measurements: 9.6 x 4.7 x 5.5 cm = volume: 124 mL . Echogenicity within normal limits. No mass or hydronephrosis visualized. Left Kidney: Renal measurements: 9.9 x 5.4 x 4.9 cm = volume: 138 mL. Echogenicity within normal limits. No mass or hydronephrosis visualized. Trace amount noted about the lower pole left kidney. Bladder: Appears normal for degree of bladder distention. Bilateral ureteral jets. Prostate is prominent. Other: 1.  Bilateral pleural effusions. IMPRESSION: 1.  Trace amount of fluid noted about the lower pole left kidney. Kidneys are otherwise unremarkable. No hydronephrosis. Bilateral ureteral jets noted. 2.  Prostate is prominent.  No bladder distention. Electronically Signed   By: Maisie Fus  Register   On: 12/08/2019 13:45     Scheduled Meds:   . azithromycin  500 mg Oral Daily  . benzonatate  200 mg Oral TID  . budesonide (PULMICORT) nebulizer solution  0.25 mg Nebulization BID  . enoxaparin (LOVENOX) injection  40 mg Subcutaneous Q24H  . fluticasone  2 spray Each Nare Daily  . guaiFENesin  1,200 mg Oral BID  . insulin aspart  0-5 Units Subcutaneous QHS  . insulin aspart  0-9 Units Subcutaneous TID WC  . levalbuterol  1.25 mg Nebulization Q8H  . multivitamin with minerals  1 tablet Oral Daily  . pantoprazole  40 mg Oral Daily  . sodium chloride flush  3 mL Intravenous Q12H    Continuous Infusions:   . sodium chloride    . cefTRIAXone (ROCEPHIN)  IV 2 g (12/08/19 0858)     LOS: 3 days     Marcellus Scott, MD, [place], [facility]. [facility]    To contact the attending provider between 7A-7P or the covering provider during after hours 7P-7A, please log into the web site [email or web address] and access using universal [place] password for that web site. If you do not have the password, please call the hospital operator.  12/08/2019, 6:35 PM

## 2019-12-08 NOTE — Consult Note (Signed)
Reason for Consult: Acute respiratory failure with hypoxia, bilateral pleural effusions, lung nodules query pneumonia. Referring Physician: Marcina Millard, MD  Terrance Watson is an 64 y.o. male.  HPI: This is a 64 year old lifelong never smoker with a history of diabetes mellitus and chronic kidney disease stage III with a 4 to 6-week history of shortness of breath with worsening 2 days prior to admission.  He was admitted on 28 June to [facility].  He states that initially he was exposed to a grandson that had "croup" subsequently after that developed hoarseness this was a little bit over a month ago.  On the beginning of June he was evaluated at urgent care and was treated for potential laryngitis with antibiotics and steroids.  Initially he felt somewhat better but then had difficulties with persistent thick secretions that he could not bring up.  He has not been aware of any fevers, chills or sweats.  Sputum was clear to white and no hemoptysis at that time.  He has had some minor hemoptysis while inpatient after initiation of anticoagulants and with perceived upper airway irritation.  Patient [place] days prior to admission and noted that he was having shortness of breath at that time.  Once he got back home he noticed worsening of the shortness of breath and on the day of admission he was noted to have significant breathlessness and had to call EMS for assistance.  Oxygen saturations at the time were noted to be 70%.  He has not had any chest pain during this time.  He has had orthopnea but no paroxysmal nocturnal dyspnea.  No nausea or vomiting.  Initial BNP was noted to be 718 BNP yesterday was 1073.  2D echo has revealed ejection fraction of 35 to 40% no wall motion abnormalities (no prior echo for comparison).  Procalcitonin was noted to be less than 0.10.  The patient has not had significant leukocytosis, or documented fever.  Sputum has been negative thus far.  COVID-19  testing has been negative however the patient was never tested at the beginning of his illness.  Patient is not vaccinated against COVID-19.  CT scan of the chest was performed on 28 June, this was independently reviewed and shows significant bilateral pleural effusions with basilar compressive atelectasis and multifocal airspace consolidation with multiple nodular opacities that appear to have groundglass quality to them.  There is a prominent subcarinal lymph node as well.  There was no PE noted.  Patient has not had follow-up imaging since he was diuresed on admission.  I reviewed the patient's hospital course he initially required BiPAP transiently now down to 2 L/min via nasal cannula.     Past Medical History:  Diagnosis Date  . Diabetes mellitus without complication (HCC)   . Hypertension     Past Surgical History:  Procedure Laterality Date  . ADENOIDECTOMY    . COLONOSCOPY WITH PROPOFOL N/A 02/18/2016   Procedure: COLONOSCOPY WITH PROPOFOL;  Surgeon: Christena Deem, MD;  Location: [facility] ENDOSCOPY;  Service: Endoscopy;  Laterality: N/A;  . ESOPHAGOGASTRODUODENOSCOPY (EGD) WITH PROPOFOL N/A 02/18/2016   Procedure: ESOPHAGOGASTRODUODENOSCOPY (EGD) WITH PROPOFOL;  Surgeon: Christena Deem, MD;  Location: [facility] ENDOSCOPY;  Service: Endoscopy;  Laterality: N/A;  . Surgery for broken nose      Family History  Problem Relation Age of Onset  . Diabetes Father   . Diabetes Paternal Uncle     Social History:  reports that he has never smoked. He has never  used smokeless tobacco. He reports that he does not drink alcohol and does not use drugs.  Allergies:  Allergies  Allergen Reactions  . Penicillins Rash    Did it involve swelling of the face/tongue/throat, SOB, or low BP? No Did it involve sudden or severe rash/hives, skin peeling, or any reaction on the inside of your mouth or nose? Yes Did you need to seek medical attention at a hospital or doctor's office? No When did it  last happen? If all above answers are "NO", may proceed with cephalosporin use.    Medications:  I have reviewed the patient's current medications. Prior to Admission:  Medications Prior to Admission  Medication Sig Dispense Refill Last Dose  . brompheniramine-pseudoephedrine-DM 30-2-10 MG/5ML syrup Take 10 mLs by mouth every 4 (four) hours as needed.   12/04/2019 at Unknown time  . doxycycline (VIBRAMYCIN) 100 MG capsule Take 100 mg by mouth 2 (two) times daily.   12/04/2019 at Unknown time  . ezetimibe (ZETIA) 10 MG tablet Take 10 mg by mouth daily.   12/04/2019 at Unknown time  . glipiZIDE (GLUCOTROL) 5 MG tablet Take 5 mg by mouth daily before breakfast.   12/04/2019 at Unknown time  . metFORMIN (GLUCOPHAGE) 500 MG tablet Take 500 mg by mouth daily with breakfast.    12/04/2019 at Unknown time  . Multiple Vitamin (MULTIVITAMIN) tablet Take 1 tablet by mouth daily.   12/04/2019 at Unknown time  . Omeprazole-Sodium Bicarbonate (ZEGERID) 20-1100 MG CAPS capsule Take 1 capsule by mouth daily before breakfast.   12/04/2019 at Unknown time  . benzonatate (TESSALON) 100 MG capsule Take 100 mg by mouth 3 (three) times daily as needed.   PRN at PRN  . fluticasone (FLONASE) 50 MCG/ACT nasal spray Place 2 sprays into both nostrils daily.   PRN at PRN    Results for orders placed or performed during the hospital encounter of Dec 28, 2019 (from the past 48 hour(s))  Troponin I (High Sensitivity)     Status: Abnormal   Collection Time: 12/06/19 10:40 AM  Result Value Ref Range   Troponin I (High Sensitivity) 218 (HH) <18 ng/L    Comment: CRITICAL VALUE NOTED. VALUE IS CONSISTENT WITH PREVIOUSLY REPORTED/CALLED VALUE/HKP (NOTE) Elevated high sensitivity troponin I (hsTnI) values and significant  changes across serial measurements may suggest ACS but many other  chronic and acute conditions are known to elevate hsTnI results.  Refer to the "Links" section for chest pain algorithms and additional   guidance. Performed at [facility], [address] [place], [place] [address]   Mycoplasma pneumoniae antibody, IgM     Status: None   Collection Time: 12/06/19 10:40 AM  Result Value Ref Range   Mycoplasma pneumo IgM <770 0 - 769 U/mL    Comment: (NOTE)                             Negative            <770 Clinically significant amount of M. pneumoniae antibody not detected.                             Low Positive   770 - 950 M. pneumoniae specific IgM presumptively detected.  It is recommended that another sample be collected 1-2 weeks later to assure reactivity.  Positive            >950 Highly significant amount of M. pneumoniae specific IgM antibody detected. Performed At: [facility] [address] [place], [place] [id number] Jolene Schimke MD [id number]   Glucose, capillary     Status: Abnormal   Collection Time: 12/06/19 12:15 PM  Result Value Ref Range   Glucose-Capillary 178 (H) 70 - 99 mg/dL    Comment: Glucose reference range applies only to samples taken after fasting for at least 8 hours.  Troponin I (High Sensitivity)     Status: Abnormal   Collection Time: 12/06/19  1:43 PM  Result Value Ref Range   Troponin I (High Sensitivity) 189 (HH) <18 ng/L    Comment: CRITICAL VALUE NOTED. VALUE IS CONSISTENT WITH PREVIOUSLY REPORTED/CALLED VALUE DB (NOTE) Elevated high sensitivity troponin I (hsTnI) values and significant  changes across serial measurements may suggest ACS but many other  chronic and acute conditions are known to elevate hsTnI results.  Refer to the "Links" section for chest pain algorithms and additional  guidance. Performed at [facility], [address] [place], [place] [address]   Troponin I (High Sensitivity)     Status: Abnormal   Collection Time: 12/06/19  3:14 PM  Result Value Ref Range   Troponin I (High Sensitivity) 181 (HH) <18 ng/L    Comment: CRITICAL VALUE NOTED. VALUE  IS CONSISTENT WITH PREVIOUSLY REPORTED/CALLED VALUE DB (NOTE) Elevated high sensitivity troponin I (hsTnI) values and significant  changes across serial measurements may suggest ACS but many other  chronic and acute conditions are known to elevate hsTnI results.  Refer to the "Links" section for chest pain algorithms and additional  guidance. Performed at [facility], [address] [place], [place] [address]   Expectorated sputum assessment w rflx to resp cult     Status: None   Collection Time: 12/06/19  4:23 PM   Specimen: Sputum  Result Value Ref Range   Specimen Description SPUTUM    Special Requests NONE    Sputum evaluation      Sputum specimen not acceptable for testing.  Please recollect.   RESULT CALLED TO, READ BACK BY AND VERIFIED WITH: Hyman Hopes 12/06/19 1712 KLW Performed at [facility], [address] [place], [place] [address]    Report Status 12/06/2019 FINAL   Legionella Pneumophila Serogp 1 Ur Ag     Status: None   Collection Time: 12/06/19  4:23 PM  Result Value Ref Range   L. pneumophila Serogp 1 Ur Ag Negative Negative    Comment: (NOTE) Presumptive negative for L. pneumophila serogroup 1 antigen in urine, suggesting no recent or current infection. Legionnaires' disease cannot be ruled out since other serogroups and species may also cause disease. Performed At: [facility] [address] [place], [place] [id number] Jolene Schimke MD [id number]    Source of Sample URINE, RANDOM     Comment: Performed at [facility], [address] [place], [place] [address]  Strep pneumoniae urinary antigen     Status: None   Collection Time: 12/06/19  4:23 PM  Result Value Ref Range   Strep Pneumo Urinary Antigen NEGATIVE NEGATIVE    Comment:        Infection due to S. pneumoniae cannot be absolutely ruled out since the antigen present may be below the detection limit of the test. Performed at [facility], [address] [place], [place] [address]   Glucose, capillary  Status: Abnormal   Collection Time: 12/06/19  4:41 PM  Result Value Ref Range   Glucose-Capillary 157 (H) 70 - 99 mg/dL    Comment: Glucose reference range applies only to samples taken after fasting for at least 8 hours.  Glucose, capillary     Status: Abnormal   Collection Time: 12/06/19  9:20 PM  Result Value Ref Range   Glucose-Capillary 156 (H) 70 - 99 mg/dL    Comment: Glucose reference range applies only to samples taken after fasting for at least 8 hours.  Expectorated sputum assessment w rflx to resp cult     Status: None   Collection Time: 12/06/19 10:00 PM   Specimen: SPU  Result Value Ref Range   Specimen Description SPUTUM    Special Requests NONE    Sputum evaluation      THIS SPECIMEN IS ACCEPTABLE FOR SPUTUM CULTURE Performed at [facility], [address], [place], [place] [address]    Report Status 12/07/2019 FINAL   Culture, respiratory     Status: None (Preliminary result)   Collection Time: 12/06/19 10:00 PM   Specimen: SPU  Result Value Ref Range   Specimen Description      SPUTUM Performed at [facility], [address] [place], [place] [address]    Special Requests      NONE Reflexed from [id number] Performed at [facility], [address] [place], [place] [address]    Gram Stain PENDING    Culture      NO GROWTH < 24 HOURS Performed at [facility], [address], [place], [place] [address]    Report Status PENDING   CBC with Differential/Platelet     Status: Abnormal   Collection Time: 12/07/19  5:51 AM  Result Value Ref Range   WBC 10.4 4.0 - 10.5 K/uL   RBC 3.51 (L) 4.22 - 5.81 MIL/uL   Hemoglobin 10.5 (L) 13.0 - 17.0 g/dL   HCT 92.1 (L) 39 - 52 %   MCV 89.5 80.0 - 100.0 fL   MCH 29.9 26.0 - 34.0 pg   MCHC 33.4 30.0 - 36.0 g/dL   RDW 19.4 17.4 - 08.1 %   Platelets 301 150 - 400 K/uL   nRBC 0.0 0.0 - 0.2 %   Neutrophils Relative %  90 %   Neutro Abs 9.4 (H) 1.7 - 7.7 K/uL   Lymphocytes Relative 4 %   Lymphs Abs 0.4 (L) 0.7 - 4.0 K/uL   Monocytes Relative 6 %   Monocytes Absolute 0.6 0 - 1 K/uL   Eosinophils Relative 0 %   Eosinophils Absolute 0.0 0 - 0 K/uL   Basophils Relative 0 %   Basophils Absolute 0.0 0 - 0 K/uL   Immature Granulocytes 0 %   Abs Immature Granulocytes 0.04 0.00 - 0.07 K/uL    Comment: Performed at [facility], [address] [place], [place] [address]  Comprehensive metabolic panel     Status: Abnormal   Collection Time: 12/07/19  5:51 AM  Result Value Ref Range   Sodium 136 135 - 145 mmol/L   Potassium 4.3 3.5 - 5.1 mmol/L   Chloride 95 (L) 98 - 111 mmol/L   CO2 25 22 - 32 mmol/L   Glucose, Bld 187 (H) 70 - 99 mg/dL    Comment: Glucose reference range applies only to samples taken after fasting for at least 8 hours.   BUN 43 (H) 8 - 23 mg/dL  Creatinine, Ser 1.58 (H) 0.61 - 1.24 mg/dL   Calcium 8.7 (L) 8.9 - 10.3 mg/dL   Total Protein 6.8 6.5 - 8.1 g/dL   Albumin 3.6 3.5 - 5.0 g/dL   AST 20 15 - 41 U/L   ALT 23 0 - 44 U/L   Alkaline Phosphatase 80 38 - 126 U/L   Total Bilirubin 1.2 0.3 - 1.2 mg/dL   GFR calc non Af Amer 46 (L) >60 mL/min   GFR calc Af Amer 54 (L) >60 mL/min   Anion gap 16 (H) 5 - 15    Comment: Performed at [facility], [address], [place], [place] [address]  Magnesium     Status: None   Collection Time: 12/07/19  5:51 AM  Result Value Ref Range   Magnesium 2.1 1.7 - 2.4 mg/dL    Comment: Performed at [facility], [address] [place], [place] [address]  Phosphorus     Status: Abnormal   Collection Time: 12/07/19  5:51 AM  Result Value Ref Range   Phosphorus 4.9 (H) 2.5 - 4.6 mg/dL    Comment: Performed at [facility], [address] [place], [place] [address]  Brain natriuretic peptide     Status: Abnormal   Collection Time: 12/07/19  5:51 AM  Result Value Ref Range   B Natriuretic Peptide 1,073.0 (H)  0.0 - 100.0 pg/mL    Comment: Performed at [facility], [address] [place], [place] [address]  Glucose, capillary     Status: Abnormal   Collection Time: 12/07/19  7:29 AM  Result Value Ref Range   Glucose-Capillary 175 (H) 70 - 99 mg/dL    Comment: Glucose reference range applies only to samples taken after fasting for at least 8 hours.  Glucose, capillary     Status: Abnormal   Collection Time: 12/07/19 11:36 AM  Result Value Ref Range   Glucose-Capillary 182 (H) 70 - 99 mg/dL    Comment: Glucose reference range applies only to samples taken after fasting for at least 8 hours.  Glucose, capillary     Status: Abnormal   Collection Time: 12/07/19  4:32 PM  Result Value Ref Range   Glucose-Capillary 150 (H) 70 - 99 mg/dL    Comment: Glucose reference range applies only to samples taken after fasting for at least 8 hours.  Glucose, capillary     Status: Abnormal   Collection Time: 12/07/19  9:16 PM  Result Value Ref Range   Glucose-Capillary 164 (H) 70 - 99 mg/dL    Comment: Glucose reference range applies only to samples taken after fasting for at least 8 hours.  Comprehensive metabolic panel     Status: Abnormal   Collection Time: 12/08/19  4:10 AM  Result Value Ref Range   Sodium 135 135 - 145 mmol/L   Potassium 3.9 3.5 - 5.1 mmol/L   Chloride 95 (L) 98 - 111 mmol/L   CO2 27 22 - 32 mmol/L   Glucose, Bld 177 (H) 70 - 99 mg/dL    Comment: Glucose reference range applies only to samples taken after fasting for at least 8 hours.   BUN 51 (H) 8 - 23 mg/dL   Creatinine, Ser 2.95 (H) 0.61 - 1.24 mg/dL   Calcium 8.5 (L) 8.9 - 10.3 mg/dL   Total Protein 6.3 (L) 6.5 - 8.1 g/dL   Albumin 3.4 (L) 3.5 - 5.0 g/dL   AST 21 15 - 41 U/L   ALT 22 0 -  44 U/L   Alkaline Phosphatase 70 38 - 126 U/L   Total Bilirubin 1.1 0.3 - 1.2 mg/dL   GFR calc non Af Amer 43 (L) >60 mL/min   GFR calc Af Amer 50 (L) >60 mL/min   Anion gap 13 5 - 15    Comment: Performed at [facility], [address] [place], [place] [address]  Glucose, capillary     Status: Abnormal   Collection Time: 12/08/19  7:41 AM  Result Value Ref Range   Glucose-Capillary 190 (H) 70 - 99 mg/dL    Comment: Glucose reference range applies only to samples taken after fasting for at least 8 hours.    No results found.  Review of Systems  A 10 point review of systems was performed and it is as noted above otherwise negative.  Blood pressure 114/77, pulse 100, temperature 97.9 F (36.6 C), resp. rate 18, height  (1.727 m), weight 69.8 kg, SpO2 94 %.  On 2 L/min nasal cannula O2. Physical Exam GENERAL: Awake, alert, thin gentleman, mildly tachypneic otherwise no distress, nasal cannula in place. HEAD: Normocephalic, atraumatic.  EYES: Pupils equal, round, reactive to light.  No scleral icterus.  MOUTH: Oral mucosa moist, no thrush. NECK: Supple. No thyromegaly. Trachea midline. No JVD.  No adenopathy. PULMONARY: Symmetrical air entry bilaterally.  Diminished breath sounds at the bases, no wheezes.  No other adventitious sounds. CARDIOVASCULAR: S1 and S2.  Tachycardic, regular rhythm.  No overt rubs murmurs or gallops heard. GASTROINTESTINAL: Abdomen is not distended, soft. MUSCULOSKELETAL: No joint deformity, no clubbing, no edema.  NEUROLOGIC:, Alert, no focal deficits noted.  No asterixis. SKIN: Intact,warm,dry.  On limited exam no rashes. PSYCH: Mood and behavior is normal.   Assessment/Plan:  Acute respiratory failure with hypoxia Bilateral pleural effusions Decreased LVEF 35 to 40% Elevated BNP Consistent with acute systolic heart failure (CHF) No wall motion abnormalities noted on echo Query post viral myocarditis Diuresis as tolerated May need inotrope support Repeat chest x-ray prior to determining whether thoracentesis needed Wean O2 as tolerated for saturations of 92% or better  Bilateral consolidative infiltrate/atelectasis Groundglass opacities Reactive  lymphadenopathy Currently there is no evidence of active infectious process, at least bacterial Patient may have had atypical pneumonia, query viral early on in course Note that imaging takes at least 8 to 12 weeks to clear after a bout of pneumonia We will need to repeat imaging in another 6 to 8 weeks Pulmonary edema will aggravate all of the above findings Currently do not see need for ongoing antibiotic therapy Has tenacious secretions we will add bronchodilators/pulmonary toilet Instructed on use of Acapella flutter valve  Acute on chronic kidney injury Chronic kidney disease stage III Recommend nephrology consult Discussed with Dr. Wynelle Link  Diabetes mellitus type 2 This issue adds complexity to his management  Prophylaxis Lovenox for DVT PPI for GI issues  As an aside: Recommend discontinuing Florastor (Saccharomyces boulardii) currently until patient's acute illness resolves. Discussed with Dr. Waymon Amato patient's hospitalist.  Patient and wife were made aware of care plan.  Gailen Shelter, MD [facility] 12/08/2019, 10:23 AM

## 2019-12-08 NOTE — Plan of Care (Signed)
  Problem: Education: Goal: Knowledge of General Education information will improve Description Including pain rating scale, medication(s)/side effects and non-pharmacologic comfort measures Outcome: Progressing   Problem: Clinical Measurements: Goal: Ability to maintain clinical measurements within normal limits will improve Outcome: Progressing Goal: Respiratory complications will improve Outcome: Progressing   Problem: Activity: Goal: Risk for activity intolerance will decrease Outcome: Progressing   Problem: Safety: Goal: Ability to remain free from injury will improve Outcome: Progressing   

## 2019-12-08 NOTE — Consult Note (Signed)
[facility]  CONSULT NOTE    Date: 12/08/2019                  Patient Name:  Terrance Watson  MRN: [id number]  DOB: November 15, 1955  Age / Sex: 64 y.o., male         PCP: Kandyce Rud, MD                 Service Requesting Consult: Dr. Waymon Amato                 Reason for Consult: Acute renal failure            History of Present Illness: Mr. Terrance Watson admitted for Respiratory failure with hypoxia [facility]) [J96.91] Acute respiratory failure with hypoxia (HCC) [J96.01]  Wife at bedside. Patient with shortness of breath. He then was started on IV furosemide. Creatinine on admission of 1.62 and has trended to 1.67. Nephrology consulted.   Patient has been diagnosis with diabetes mellitus type II for last 2 years. Not on insulin. Patient has complications of diabetic retinopathy and diabetic neuropathy.   Echocardiogram found decreased systolic function on this admission.    Medications: Outpatient medications: Medications Prior to Admission  Medication Sig Dispense Refill Last Dose  . brompheniramine-pseudoephedrine-DM 30-2-10 MG/5ML syrup Take 10 mLs by mouth every 4 (four) hours as needed.   12/04/2019 at Unknown time  . doxycycline (VIBRAMYCIN) 100 MG capsule Take 100 mg by mouth 2 (two) times daily.   12/04/2019 at Unknown time  . ezetimibe (ZETIA) 10 MG tablet Take 10 mg by mouth daily.   12/04/2019 at Unknown time  . glipiZIDE (GLUCOTROL) 5 MG tablet Take 5 mg by mouth daily before breakfast.   12/04/2019 at Unknown time  . metFORMIN (GLUCOPHAGE) 500 MG tablet Take 500 mg by mouth daily with breakfast.    12/04/2019 at Unknown time  . Multiple Vitamin (MULTIVITAMIN) tablet Take 1 tablet by mouth daily.   12/04/2019 at Unknown time  . Omeprazole-Sodium Bicarbonate (ZEGERID) 20-1100 MG CAPS capsule Take 1 capsule by mouth daily before breakfast.   12/04/2019 at Unknown time  . benzonatate (TESSALON) 100 MG capsule Take 100 mg by mouth 3 (three) times daily as  needed.   PRN at PRN  . fluticasone (FLONASE) 50 MCG/ACT nasal spray Place 2 sprays into both nostrils daily.   PRN at PRN    Current medications: Current Facility-Administered Medications  Medication Dose Route Frequency Provider Last Rate Last Admin  . 0.9 %  sodium chloride infusion  250 mL Intravenous PRN Agbata, Tochukwu, MD      . acetaminophen (TYLENOL) tablet 650 mg  650 mg Oral Q6H PRN Agbata, Tochukwu, MD       Or  . acetaminophen (TYLENOL) suppository 650 mg  650 mg Rectal Q6H PRN Agbata, Tochukwu, MD      . azithromycin (ZITHROMAX) tablet 500 mg  500 mg Oral Daily Albina Billet, RPH   500 mg at 12/08/19 0849  . benzonatate (TESSALON) capsule 200 mg  200 mg Oral TID Elease Etienne, MD   200 mg at 12/08/19 0850  . budesonide (PULMICORT) nebulizer solution 0.25 mg  0.25 mg Nebulization BID Salena Saner, MD   0.25 mg at 12/08/19 1359  . cefTRIAXone (ROCEPHIN) 2 g in sodium chloride 0.9 % 100 mL IVPB  2 g Intravenous Q24H Willy Eddy, MD 200 mL/hr at 12/08/19 0858 2 g at 12/08/19 0858  . enoxaparin (LOVENOX) injection 40 mg  40 mg Subcutaneous Q24H Dorcas Carrow, MD   40 mg at 12/07/19 2128  . fluticasone (FLONASE) 50 MCG/ACT nasal spray 2 spray  2 spray Each Nare Daily Agbata, Tochukwu, MD   2 spray at 12/08/19 0849  . guaiFENesin (MUCINEX) 12 hr tablet 1,200 mg  1,200 mg Oral BID Marcellus Scott D, MD   1,200 mg at 12/08/19 0849  . guaiFENesin (ROBITUSSIN) 100 MG/5ML solution 200 mg  10 mL Oral Q4H PRN Dorcas Carrow, MD   200 mg at 12/06/19 1618  . insulin aspart (novoLOG) injection 0-5 Units  0-5 Units Subcutaneous QHS Agbata, Tochukwu, MD      . insulin aspart (novoLOG) injection 0-9 Units  0-9 Units Subcutaneous TID WC Agbata, Tochukwu, MD   2 Units at 12/08/19 0849  . levalbuterol (XOPENEX) nebulizer solution 1.25 mg  1.25 mg Nebulization Q8H Salena Saner, MD   1.25 mg at 12/08/19 1358  . multivitamin with minerals tablet 1 tablet  1 tablet Oral Daily  Agbata, Tochukwu, MD   1 tablet at 12/08/19 0849  . ondansetron (ZOFRAN) tablet 4 mg  4 mg Oral Q6H PRN Agbata, Tochukwu, MD       Or  . ondansetron (ZOFRAN) injection 4 mg  4 mg Intravenous Q6H PRN Agbata, Tochukwu, MD   4 mg at 12/07/19 0803  . pantoprazole (PROTONIX) EC tablet 40 mg  40 mg Oral Daily Agbata, Tochukwu, MD   40 mg at 12/08/19 0849  . sodium chloride flush (NS) 0.9 % injection 3 mL  3 mL Intravenous Q12H Agbata, Tochukwu, MD   3 mL at 12/08/19 0850  . sodium chloride flush (NS) 0.9 % injection 3 mL  3 mL Intravenous PRN Agbata, Tochukwu, MD      . zolpidem (AMBIEN) tablet 5 mg  5 mg Oral QHS PRN Elease Etienne, MD   5 mg at 12/07/19 2138      Allergies: Allergies  Allergen Reactions  . Penicillins Rash    Did it involve swelling of the face/tongue/throat, SOB, or low BP? No Did it involve sudden or severe rash/hives, skin peeling, or any reaction on the inside of your mouth or nose? Yes Did you need to seek medical attention at a hospital or doctor's office? No When did it last happen? If all above answers are "NO", may proceed with cephalosporin use.      Past Medical History: Past Medical History:  Diagnosis Date  . Diabetes mellitus without complication (HCC)   . Hypertension      Past Surgical History: Past Surgical History:  Procedure Laterality Date  . ADENOIDECTOMY    . COLONOSCOPY WITH PROPOFOL N/A 02/18/2016   Procedure: COLONOSCOPY WITH PROPOFOL;  Surgeon: Christena Deem, MD;  Location: [facility] ENDOSCOPY;  Service: Endoscopy;  Laterality: N/A;  . ESOPHAGOGASTRODUODENOSCOPY (EGD) WITH PROPOFOL N/A 02/18/2016   Procedure: ESOPHAGOGASTRODUODENOSCOPY (EGD) WITH PROPOFOL;  Surgeon: Christena Deem, MD;  Location: [facility] ENDOSCOPY;  Service: Endoscopy;  Laterality: N/A;  . Surgery for broken nose       Family History: Family History  Problem Relation Age of Onset  . Diabetes Father   . Diabetes Paternal Uncle      Social History: Social  History   Socioeconomic History  . Marital status: Married    Spouse name: Not on file  . Number of children: Not on file  . Years of education: Not on file  . Highest education level: Not on file  Occupational History  . Not on file  Tobacco Use  . Smoking status: Never Smoker  . Smokeless tobacco: Never Used  Substance and Sexual Activity  . Alcohol use: No    Alcohol/week: 0.0 standard drinks  . Drug use: No  . Sexual activity: Not on file  Other Topics Concern  . Not on file  Social History Narrative  . Not on file   Social Determinants of Health   Financial Resource Strain:   . Difficulty of Paying Living Expenses:   Food Insecurity:   . Worried About Programme researcher, broadcasting/film/video in the Last Year:   . Barista in the Last Year:   Transportation Needs:   . Freight forwarder (Medical):   Marland Kitchen Lack of Transportation (Non-Medical):   Physical Activity:   . Days of Exercise per Week:   . Minutes of Exercise per Session:   Stress:   . Feeling of Stress :   Social Connections:   . Frequency of Communication with Friends and Family:   . Frequency of Social Gatherings with Friends and Family:   . Attends Religious Services:   . Active Member of Clubs or Organizations:   . Attends Banker Meetings:   Marland Kitchen Marital Status:   Intimate Partner Violence:   . Fear of Current or Ex-Partner:   . Emotionally Abused:   Marland Kitchen Physically Abused:   . Sexually Abused:      Review of Systems: Review of Systems  Constitutional: Negative.   HENT: Negative.   Eyes: Negative.   Respiratory: Positive for cough, hemoptysis, sputum production, shortness of breath and wheezing.   Cardiovascular: Positive for chest pain, palpitations, orthopnea, claudication, leg swelling and PND.  Gastrointestinal: Negative.   Genitourinary: Positive for dysuria, flank pain, frequency, hematuria and urgency.  Musculoskeletal: Negative for back pain, falls, joint pain, myalgias and neck pain.   Skin: Negative.   Neurological: Negative.   Endo/Heme/Allergies: Negative.   Psychiatric/Behavioral: Negative.     Vital Signs: Blood pressure 114/77, pulse 100, temperature 97.9 F (36.6 C), resp. rate 18, height 5\' 8"  (1.727 m), weight 69.8 kg, SpO2 93 %.  Weight trends: Filed Weights   12/06/19 0511 12/07/19 0500 12/08/19 0423  Weight: 70.5 kg 70.1 kg 69.8 kg    Physical Exam: General: NAD,   Head: Normocephalic, atraumatic. Moist oral mucosal membranes  Eyes: Anicteric, PERRL  Neck: Supple, trachea midline  Lungs:  Clear to auscultation  Heart: Regular rate and rhythm  Abdomen:  Soft, nontender,   Extremities:  no peripheral edema.  Neurologic: Nonfocal, moving all four extremities  Skin: No lesions        Lab results: Basic Metabolic Panel: Recent Labs  Lab 12/06/19 0043 12/07/19 0551 12/08/19 0410  NA 137 136 135  K 4.4 4.3 3.9  CL 98 95* 95*  CO2 26 25 27   GLUCOSE 165* 187* 177*  BUN 34* 43* 51*  CREATININE 1.56* 1.58* 1.67*  CALCIUM 8.5* 8.7* 8.5*  MG  --  2.1  --   PHOS  --  4.9*  --     Liver Function Tests: Recent Labs  Lab 11/21/2019 0525 12/07/19 0551 12/08/19 0410  AST 27 20 21   ALT 26 23 22   ALKPHOS 85 80 70  BILITOT 1.1 1.2 1.1  PROT 6.3* 6.8 6.3*  ALBUMIN 3.3* 3.6 3.4*   No results for input(s): LIPASE, AMYLASE in the last 168 hours. No results for input(s): AMMONIA in the last 168 hours.  CBC: Recent Labs  Lab 11/09/2019 0525 12/06/19  0043 12/07/19 0551  WBC 8.4 12.5* 10.4  NEUTROABS  --   --  9.4*  HGB 10.8* 10.5* 10.5*  HCT 33.1* 31.9* 31.4*  MCV 89.9 90.9 89.5  PLT 268 268 301    Cardiac Enzymes: No results for input(s): CKTOTAL, CKMB, CKMBINDEX, TROPONINI in the last 168 hours.  BNP: Invalid input(s): POCBNP  CBG: Recent Labs  Lab 12/07/19 0729 12/07/19 1136 12/07/19 1632 12/07/19 2116 12/08/19 0741  GLUCAP 175* 182* 150* 164* 190*    Microbiology: Results for orders placed or performed during the  hospital encounter of 11/23/2019  SARS Coronavirus 2 by RT PCR (hospital order, performed in [facility] hospital lab) Nasopharyngeal Nasopharyngeal Swab     Status: None   Collection Time: 11/20/2019  5:25 AM   Specimen: Nasopharyngeal Swab  Result Value Ref Range Status   SARS Coronavirus 2 NEGATIVE NEGATIVE Final    Comment: (NOTE) SARS-CoV-2 target nucleic acids are NOT DETECTED.  The SARS-CoV-2 RNA is generally detectable in upper and lower respiratory specimens during the acute phase of infection. The lowest concentration of SARS-CoV-2 viral copies this assay can detect is 250 copies / mL. A negative result does not preclude SARS-CoV-2 infection and should not be used as the sole basis for treatment or other patient management decisions.  A negative result may occur with improper specimen collection / handling, submission of specimen other than nasopharyngeal swab, presence of viral mutation(s) within the areas targeted by this assay, and inadequate number of viral copies (<250 copies / mL). A negative result must be combined with clinical observations, patient history, and epidemiological information.  Fact Sheet for Patients:   BoilerBrush.com.cy  Fact Sheet for Healthcare Providers: https://pope.com/  This test is not yet approved or  cleared by the [place] FDA and has been authorized for detection and/or diagnosis of SARS-CoV-2 by FDA under an Emergency Use Authorization (EUA).  This EUA will remain in effect (meaning this test can be used) for the duration of the COVID-19 declaration under Section 564(b)(1) of the Act, 21 U.S.C. section 360bbb-3(b)(1), unless the authorization is terminated or revoked sooner.  Performed at [facility], [address] [place], [place] [address]   Blood culture (routine x 2)     Status: None (Preliminary result)   Collection Time: 11/28/2019  5:54 AM   Specimen: BLOOD  Result  Value Ref Range Status   Specimen Description BLOOD LEFT AC  Final   Special Requests   Final    BOTTLES DRAWN AEROBIC AND ANAEROBIC Blood Culture results may not be optimal due to an excessive volume of blood received in culture bottles   Culture   Final    NO GROWTH 3 DAYS Performed at [facility], [address], [place], [place] [address]    Report Status PENDING  Incomplete  Blood culture (routine x 2)     Status: None (Preliminary result)   Collection Time: 11/19/2019  6:13 AM   Specimen: BLOOD  Result Value Ref Range Status   Specimen Description BLOOD RIGHT HAND  Final   Special Requests   Final    BOTTLES DRAWN AEROBIC AND ANAEROBIC Blood Culture results may not be optimal due to an inadequate volume of blood received in culture bottles   Culture   Final    NO GROWTH 3 DAYS Performed at [facility], [address] [place], [place] [address]    Report Status PENDING  Incomplete  Expectorated sputum assessment w rflx to resp cult  Status: None   Collection Time: 12/06/19  4:23 PM   Specimen: Sputum  Result Value Ref Range Status   Specimen Description SPUTUM  Final   Special Requests NONE  Final   Sputum evaluation   Final    Sputum specimen not acceptable for testing.  Please recollect.   RESULT CALLED TO, READ BACK BY AND VERIFIED WITH: Hyman Hopes 12/06/19 1712 KLW Performed at [facility], [address] [place], [place] [address]    Report Status 12/06/2019 FINAL  Final  Expectorated sputum assessment w rflx to resp cult     Status: None   Collection Time: 12/06/19 10:00 PM   Specimen: SPU  Result Value Ref Range Status   Specimen Description SPUTUM  Final   Special Requests NONE  Final   Sputum evaluation   Final    THIS SPECIMEN IS ACCEPTABLE FOR SPUTUM CULTURE Performed at [facility], [address], [place], [place] [address]    Report Status 12/07/2019 FINAL  Final  Culture, respiratory     Status: None  (Preliminary result)   Collection Time: 12/06/19 10:00 PM   Specimen: SPU  Result Value Ref Range Status   Specimen Description   Final    SPUTUM Performed at [facility], [address] [place], [place] [address]    Special Requests   Final    NONE Reflexed from [id number] Performed at [facility], [address] [place], [place] [address]    Gram Stain PENDING  Incomplete   Culture   Final    NO GROWTH < 24 HOURS Performed at [facility], [address], [place], [place] [address]    Report Status PENDING  Incomplete    Coagulation Studies: No results for input(s): LABPROT, INR in the last 72 hours.  Urinalysis: Recent Labs    12/08/19 1312  COLORURINE YELLOW  LABSPEC 1.025  PHURINE 5.5  GLUCOSEU NEGATIVE  HGBUR NEGATIVE  BILIRUBINUR SMALL*  KETONESUR 15*  PROTEINUR 30*  NITRITE NEGATIVE  LEUKOCYTESUR NEGATIVE      Imaging: DG Chest 2 View  Result Date: 12/08/2019 CLINICAL DATA:  Cough and congestion EXAM: CHEST - 2 VIEW COMPARISON:  2019-12-30 FINDINGS: Cardiac shadow is stable. Patchy increasing infiltrates are noted particularly in the right upper lobe. Increasing bilateral effusions are noted. Central vascular congestion is again seen with interstitial edema. IMPRESSION: Changes of CHF stable from the prior exam. Increasing effusions are noted as well as patchy infiltrates bilaterally particularly in the right upper lobe. Electronically Signed   By: Alcide Clever M.D.   On: 12/08/2019 13:55   US RENAL  Result Date: 12/08/2019 CLINICAL DATA:  Chronic renal disease. Associated acute renal disease. EXAM: RENAL / URINARY TRACT ULTRASOUND COMPLETE COMPARISON:  CT chest December 30, 2019. FINDINGS: Right Kidney: Renal measurements: 9.6 x 4.7 x 5.5 cm = volume: 124 mL . Echogenicity within normal limits. No mass or hydronephrosis visualized. Left Kidney: Renal measurements: 9.9 x 5.4 x 4.9 cm = volume: 138 mL. Echogenicity within normal limits. No mass  or hydronephrosis visualized. Trace amount noted about the lower pole left kidney. Bladder: Appears normal for degree of bladder distention. Bilateral ureteral jets. Prostate is prominent. Other: 1.  Bilateral pleural effusions. IMPRESSION: 1. Trace amount of fluid noted about the lower pole left kidney. Kidneys are otherwise unremarkable. No hydronephrosis. Bilateral ureteral jets noted. 2.  Prostate is prominent.  No bladder distention. Electronically Signed   By: Maisie Fus  Register   On: 12/08/2019  13:45      Assessment & Plan: Mr. Ferman Basilio is a 64 y.o. white male with diabetes mellitus type II, hypertension, hyperlipidemia, diabetic retinopathy and diabetic neuropathy who was admitted to [facility] on 2019-12-29 for Respiratory failure with hypoxia (HCC) [J96.91] Acute respiratory failure with hypoxia (HCC) [J96.01]  1. Acute renal failure on chronic kidney disease stage IIIA with proteinuria : baseline creatinine 1.3, GFR of 56 on 06/09/2019.  Acute renal failure seems secondary to acute cardiorenal syndrome.  Chronic kidney disease work up: renal ultrasound, SPEP/UPEP, urine studies, ANA, ANCA  2. Hypertension: not currently on antihypertensive agents. Discussed role of ACE-I/ARB with patient.  New dialysis of systolic congestive heart failure.   3. Diabetes mellitus type II with chronic kidney disease: noninsulin dependent. On metformin. Not on a SGLT-2 inhibitor Hemoglobin A1c 7.2%.   LOS: 3 Quita Mcgrory 7/1/20213:02 PM

## 2019-12-08 DEATH — deceased

## 2019-12-09 ENCOUNTER — Inpatient Hospital Stay: Payer: BC Managed Care – PPO

## 2019-12-09 LAB — CBC WITH DIFFERENTIAL/PLATELET
Abs Immature Granulocytes: 0.06 10*3/uL (ref 0.00–0.07)
Basophils Absolute: 0 10*3/uL (ref 0.0–0.1)
Basophils Relative: 0 %
Eosinophils Absolute: 0 10*3/uL (ref 0.0–0.5)
Eosinophils Relative: 0 %
HCT: 31.9 % — ABNORMAL LOW (ref 39.0–52.0)
Hemoglobin: 10.7 g/dL — ABNORMAL LOW (ref 13.0–17.0)
Immature Granulocytes: 1 %
Lymphocytes Relative: 3 %
Lymphs Abs: 0.3 10*3/uL — ABNORMAL LOW (ref 0.7–4.0)
MCH: 29.8 pg (ref 26.0–34.0)
MCHC: 33.5 g/dL (ref 30.0–36.0)
MCV: 88.9 fL (ref 80.0–100.0)
Monocytes Absolute: 1.2 10*3/uL — ABNORMAL HIGH (ref 0.1–1.0)
Monocytes Relative: 12 %
Neutro Abs: 8.5 10*3/uL — ABNORMAL HIGH (ref 1.7–7.7)
Neutrophils Relative %: 84 %
Platelets: 305 10*3/uL (ref 150–400)
RBC: 3.59 MIL/uL — ABNORMAL LOW (ref 4.22–5.81)
RDW: 13.8 % (ref 11.5–15.5)
WBC: 10 10*3/uL (ref 4.0–10.5)
nRBC: 0 % (ref 0.0–0.2)

## 2019-12-09 LAB — BODY FLUID CELL COUNT WITH DIFFERENTIAL
Eos, Fluid: 0 %
Lymphs, Fluid: 38 %
Monocyte-Macrophage-Serous Fluid: 41 %
Neutrophil Count, Fluid: 21 %
Total Nucleated Cell Count, Fluid: 88 cu mm

## 2019-12-09 LAB — KAPPA/LAMBDA LIGHT CHAINS
Kappa free light chain: 16.3 mg/L (ref 3.3–19.4)
Kappa, lambda light chain ratio: 1.14 (ref 0.26–1.65)
Lambda free light chains: 14.3 mg/L (ref 5.7–26.3)

## 2019-12-09 LAB — COMPREHENSIVE METABOLIC PANEL
ALT: 23 U/L (ref 0–44)
AST: 21 U/L (ref 15–41)
Albumin: 3.4 g/dL — ABNORMAL LOW (ref 3.5–5.0)
Alkaline Phosphatase: 74 U/L (ref 38–126)
Anion gap: 12 (ref 5–15)
BUN: 54 mg/dL — ABNORMAL HIGH (ref 8–23)
CO2: 28 mmol/L (ref 22–32)
Calcium: 8.7 mg/dL — ABNORMAL LOW (ref 8.9–10.3)
Chloride: 95 mmol/L — ABNORMAL LOW (ref 98–111)
Creatinine, Ser: 1.55 mg/dL — ABNORMAL HIGH (ref 0.61–1.24)
GFR calc Af Amer: 55 mL/min — ABNORMAL LOW (ref >60)
GFR calc non Af Amer: 47 mL/min — ABNORMAL LOW (ref >60)
Glucose, Bld: 232 mg/dL — ABNORMAL HIGH (ref 70–99)
Potassium: 4 mmol/L (ref 3.5–5.1)
Sodium: 135 mmol/L (ref 135–145)
Total Bilirubin: 1 mg/dL (ref 0.3–1.2)
Total Protein: 6.6 g/dL (ref 6.5–8.1)

## 2019-12-09 LAB — GLOMERULAR BASEMENT MEMBRANE ANTIBODIES: GBM Ab: 3 units (ref 0–20)

## 2019-12-09 LAB — BRAIN NATRIURETIC PEPTIDE: B Natriuretic Peptide: 1423.8 pg/mL — ABNORMAL HIGH (ref 0.0–100.0)

## 2019-12-09 LAB — ALBUMIN, PLEURAL OR PERITONEAL FLUID: Albumin, Fluid: 1.2 g/dL

## 2019-12-09 LAB — GLUCOSE, PLEURAL OR PERITONEAL FLUID: Glucose, Fluid: 218 mg/dL

## 2019-12-09 LAB — GLUCOSE, CAPILLARY
Glucose-Capillary: 236 mg/dL — ABNORMAL HIGH (ref 70–99)
Glucose-Capillary: 192 mg/dL — ABNORMAL HIGH (ref 70–99)

## 2019-12-09 LAB — PROTEIN, PLEURAL OR PERITONEAL FLUID: Total protein, fluid: <3 g/dL

## 2019-12-09 LAB — ANA W/REFLEX: Anti Nuclear Antibody (ANA): NEGATIVE

## 2019-12-09 LAB — LACTATE DEHYDROGENASE, PLEURAL OR PERITONEAL FLUID: LD, Fluid: 58 U/L — ABNORMAL HIGH (ref 3–23)

## 2019-12-09 MED ORDER — FUROSEMIDE 10 MG/ML IJ SOLN
40.0000 mg | Freq: Once | INTRAMUSCULAR | Status: DC
  Administered 2019-12-09: 40 mg via INTRAVENOUS

## 2019-12-09 MED ORDER — FUROSEMIDE 10 MG/ML IJ SOLN
INTRAMUSCULAR | Status: AC
  Filled 2019-12-09: qty 4

## 2019-12-09 MED ORDER — CARVEDILOL 3.125 MG PO TABS: 3.1250 mg | ORAL_TABLET | Freq: Two times a day (BID) | ORAL | Status: DC

## 2019-12-09 MED ORDER — FUROSEMIDE 10 MG/ML IJ SOLN: 80.0000 mg | Freq: Once | INTRAMUSCULAR | Status: DC

## 2019-12-09 MED ORDER — MILRINONE LACTATE IN DEXTROSE 20-5 MG/100ML-% IV SOLN
0.2500 ug/kg/min | INTRAVENOUS | Status: DC
  Filled 2019-12-09: qty 100

## 2019-12-09 MED ORDER — SACUBITRIL-VALSARTAN 24-26 MG PO TABS
0.5000 | ORAL_TABLET | Freq: Two times a day (BID) | ORAL | Status: DC
  Filled 2019-12-09: qty 0.5

## 2019-12-10 LAB — PROTEIN ELECTROPHORESIS, SERUM
A/G Ratio: 1.1 (ref 0.7–1.7)
Albumin ELP: 3.3 g/dL (ref 2.9–4.4)
Alpha-1-Globulin: 0.3 g/dL (ref 0.0–0.4)
Alpha-2-Globulin: 0.9 g/dL (ref 0.4–1.0)
Beta Globulin: 1.2 g/dL (ref 0.7–1.3)
Gamma Globulin: 0.6 g/dL (ref 0.4–1.8)
Globulin, Total: 2.9 g/dL (ref 2.2–3.9)
Total Protein ELP: 6.2 g/dL (ref 6.0–8.5)

## 2019-12-10 LAB — MPO/PR-3 (ANCA) ANTIBODIES
ANCA Proteinase 3: <3.5 U/mL (ref 0.0–3.5)
Myeloperoxidase Abs: <9 U/mL (ref 0.0–9.0)

## 2019-12-10 LAB — CULTURE, RESPIRATORY W GRAM STAIN

## 2019-12-10 LAB — CULTURE, BLOOD (ROUTINE X 2)
Culture: NO GROWTH
Culture: NO GROWTH

## 2019-12-10 MED FILL — Medication: Qty: 1 | Status: AC

## 2019-12-11 LAB — PH, BODY FLUID: pH, Body Fluid: 7.5

## 2019-12-11 LAB — PROTEIN, BODY FLUID (OTHER): Total Protein, Body Fluid Other: 1.6 g/dL

## 2019-12-11 LAB — ACID FAST SMEAR (AFB, MYCOBACTERIA): Acid Fast Smear: NEGATIVE

## 2019-12-12 LAB — BODY FLUID CULTURE
Culture: NO GROWTH
Gram Stain: NONE SEEN

## 2019-12-13 LAB — PROTEIN ELECTRO, RANDOM URINE
Albumin ELP, Urine: 41.9 %
Alpha-1-Globulin, U: 6.6 %
Alpha-2-Globulin, U: 15.6 %
Beta Globulin, U: 21.1 %
Gamma Globulin, U: 14.8 %
Total Protein, Urine: 22.5 mg/dL

## 2019-12-13 LAB — CYTOLOGY - NON PAP

## 2020-01-06 ENCOUNTER — Ambulatory Visit: Payer: BC Managed Care – PPO | Admitting: Family

## 2020-01-08 NOTE — Progress Notes (Addendum)
Patient returned from thoracentesis to rm 234. Patients wife notified this RN due to audible rhonchi. Upon assessment, pt verbalized increased SOB, audible rhonchi and course crackles in lungs. MD notified of acute change and charge RN. This RN stated "pt states hes having more trouble breathing.... vitals are stable... sitting upright at a 90 degree angle. lungs sound more course than this morning." MD responded to bedside and placed orders to upgrade level of care. 02 sats decreased to the 60's, non-rebreather placed on 15L. This RN stated to call a rapid response, was notified by secretary that bed 20 is available. Pt transferred to ICU 20 by this RN and MD. Harvie Bridge given at bedside due to urgent change in level of care. Upon transfer, pt not able to breathe, HR decreased, eyes rolled to back of head and became unresponsive, spouse witnessed encounter. Code blue called.

## 2020-01-08 NOTE — Progress Notes (Signed)
[facility] Cardiology    SUBJECTIVE: Patient states to still be short of breath but slightly improved still receiving antibiotic therapy now with significant pleural effusion probably requiring thoracentesis no chest pain no previous cardiac history but found to have a cardiomyopathy there is no leg edema Case has been discussed with patient and wife would recommend heart failure therapy for now   Vitals:   12/08/19 2333 12/23/2019 0441 12/16/2019 0742 01/05/2020 1133  BP:  122/77 118/78 107/74  Pulse:  (!) 104 (!) 102 (!) 104  Resp:  20 20 19   Temp:  98.5 F (36.9 C) 98.4 F (36.9 C) 98.1 F (36.7 C)  TempSrc:  Oral  Oral  SpO2: 96% 94% 93% 94%  Weight:  69.3 kg    Height:         Intake/Output Summary (Last 24 hours) at 12/10/2019 1229 Last data filed at 12/29/2019 1100 Gross per 24 hour  Intake --  Output 425 ml  Net -425 ml      PHYSICAL EXAM  General: Well developed, well nourished, in no acute distress HEENT:  Normocephalic and atramatic Neck:  No JVD.  Lungs: Diminished bilaterally to auscultation and percussion. Heart: HRRR . Normal S1 and S2 without gallops or murmurs.  Abdomen: Bowel sounds are positive, abdomen soft and non-tender  Msk:  Back normal, normal gait. Normal strength and tone for age. Extremities: No clubbing, cyanosis or edema.   Neuro: Alert and oriented X 3. Psych:  Good affect, responds appropriately   LABS: Basic Metabolic Panel: Recent Labs    12/07/19 0551 12/07/19 0551 12/08/19 0410 12/23/2019 0553  NA 136   < > 135 135  K 4.3   < > 3.9 4.0  CL 95*   < > 95* 95*  CO2 25   < > 27 28  GLUCOSE 187*   < > 177* 232*  BUN 43*   < > 51* 54*  CREATININE 1.58*   < > 1.67* 1.55*  CALCIUM 8.7*   < > 8.5* 8.7*  MG 2.1  --   --   --   PHOS 4.9*  --   --   --    < > = values in this interval not displayed.   Liver Function Tests: Recent Labs    12/08/19 0410 12/30/2019 0553  AST 21 21  ALT 22 23  ALKPHOS 70 74  BILITOT 1.1 1.0  PROT 6.3* 6.6   ALBUMIN 3.4* 3.4*   No results for input(s): LIPASE, AMYLASE in the last 72 hours. CBC: Recent Labs    12/07/19 0551 01/06/2020 1033  WBC 10.4 10.0  NEUTROABS 9.4* 8.5*  HGB 10.5* 10.7*  HCT 31.4* 31.9*  MCV 89.5 88.9  PLT 301 305   Cardiac Enzymes: No results for input(s): CKTOTAL, CKMB, CKMBINDEX, TROPONINI in the last 72 hours. BNP: Invalid input(s): POCBNP D-Dimer: No results for input(s): DDIMER in the last 72 hours. Hemoglobin A1C: No results for input(s): HGBA1C in the last 72 hours. Fasting Lipid Panel: No results for input(s): CHOL, HDL, LDLCALC, TRIG, CHOLHDL, LDLDIRECT in the last 72 hours. Thyroid Function Tests: No results for input(s): TSH, T4TOTAL, T3FREE, THYROIDAB in the last 72 hours.  Invalid input(s): FREET3 Anemia Panel: Recent Labs    12/08/19 1053  VITAMINB12 905  FOLATE 19.0  FERRITIN 236  TIBC 316  IRON 25*    DG Chest 2 View  Result Date: 12/08/2019 CLINICAL DATA:  Cough and congestion EXAM: CHEST - 2 VIEW COMPARISON:  12-31-2019  FINDINGS: Cardiac shadow is stable. Patchy increasing infiltrates are noted particularly in the right upper lobe. Increasing bilateral effusions are noted. Central vascular congestion is again seen with interstitial edema. IMPRESSION: Changes of CHF stable from the prior exam. Increasing effusions are noted as well as patchy infiltrates bilaterally particularly in the right upper lobe. Electronically Signed   By: Alcide Clever M.D.   On: 12/08/2019 13:55   US RENAL  Result Date: 12/08/2019 CLINICAL DATA:  Chronic renal disease. Associated acute renal disease. EXAM: RENAL / URINARY TRACT ULTRASOUND COMPLETE COMPARISON:  CT chest 11/28/2019. FINDINGS: Right Kidney: Renal measurements: 9.6 x 4.7 x 5.5 cm = volume: 124 mL . Echogenicity within normal limits. No mass or hydronephrosis visualized. Left Kidney: Renal measurements: 9.9 x 5.4 x 4.9 cm = volume: 138 mL. Echogenicity within normal limits. No mass or hydronephrosis  visualized. Trace amount noted about the lower pole left kidney. Bladder: Appears normal for degree of bladder distention. Bilateral ureteral jets. Prostate is prominent. Other: 1.  Bilateral pleural effusions. IMPRESSION: 1. Trace amount of fluid noted about the lower pole left kidney. Kidneys are otherwise unremarkable. No hydronephrosis. Bilateral ureteral jets noted. 2.  Prostate is prominent.  No bladder distention. Electronically Signed   By: Maisie Fus  Register   On: 12/08/2019 13:45     Echo pending  TELEMETRY: Normal sinus rhythm nonspecific ST-T wave changes:  ASSESSMENT AND PLAN:  Principal Problem:   Respiratory failure with hypoxia (HCC) Active Problems:   Diabetes mellitus without complication (HCC)   Elevated troponin   CKD (chronic kidney disease), stage III   Hyperkalemia   Acute CHF (congestive heart failure) (HCC)   Pneumonia Cardiomyopathy moderate global unclear etiology Shortness of breath Bilateral pleural effusions  Plan Continue current therapy with broad-spectrum antibiotic therapy Supplemental oxygen as necessary for hypoxemia Will start heart failure therapy Entresto Coreg diuretics I agree with thoracentesis to help with therapeutic improvement Continue diabetes management control Elevation continue to follow with by nephrology appreciate their input Continue respiratory support for pneumonia heart failure pleural effusions Troponins are probably demand ischemia no direct therapy indicated Echocardiogram for assessment of left ventricular function valvular disease  Alwyn Pea, MD Dec 16, 2019 12:29 PM

## 2020-01-08 NOTE — Progress Notes (Signed)
Patient was transferred to ICU on non rebreather mask- while I was receiving bedside report from [place], [place]- patient suddenly became unresponsive- face turned blue- and patient's HR dropped to 30's- code blue was called-wife was at bedside- we coded patient for approximately 30 min. Patient was intubated during this time-  Dr. Jayme Cloud spoke to the wife outside the patient's room and the wife told Dr. Jayme Cloud to stop chest compressions.  Patient then expired.

## 2020-01-08 NOTE — Death Summary Note (Signed)
DEATH SUMMARY   Patient Details  Name: Dalbert Stillings MRN: [id number] DOB: [birth year]  Admission/Discharge Information   Admit Date:  2019-12-19  Date of Death: Date of Death: December 23, 2019  Time of Death: Time of Death: 10/07/34 HRS  Length of Stay: 4  Referring Physician: Kandyce Rud, MD   Reason(s) for Hospitalization  Acute respiratory failure with hypoxia  Diagnoses  Preliminary cause of death:   Secondary Diagnoses (including complications and co-morbidities):  Principal Problem:   Respiratory failure with hypoxia (HCC) Active Problems:   Diabetes mellitus without complication (HCC)   Elevated troponin   CKD (chronic kidney disease), stage III   Hyperkalemia   Acute CHF (congestive heart failure) (HCC)   Pneumonia Cardiomyopathy LVEF 30-35 % Flash pulmonary edema Mitral regurgitation query acute  Brief Hospital Course (including significant findings, care, treatment, and services provided and events leading to death)  Papa Piercefield is a 64 y.o. year old male lifelong never smoker, with medical history significant for diabetes mellitus and hypertension who presents to the emergency room for evaluation of shortness of breath.  He noticed shortness of breath in the beginning of June.  2 days prior to admission  [place] and developed sudden onset shortness of breath which has progressively worsened over that same time.  Shortness of breath was associated with a cough productive of dark brown phlegm as well as a cough with cold sweats.Patient was brought into the ER by EMS and had pulse oximetry of 70% on room air.  He was placed on 4 - 6L of oxygen with improvement in his pulse oximetry to the 80s.  Patient was then placed on a nonrebreather mask with pulse oximetry of 96%.    Subsequently was transitioned to a bubble high flow cannula and was admitted to the cardiac care floor.    The patient was seen in early June for possible  laryngitis and was treated with antibiotics with improvement in his symptoms. He was seen again 2 weeks ago at the urgent care and started on antibiotics for possible pneumonia. Patient did not receive his COVID-19 vaccination and his SARS Coronavirus 2 test is negative. Chest x-ray shows CHF with small left effusion and bibasilar atelectasis. CT angiogram of the chest was negative for PE but shows moderate free-flowing pleural effusions bilaterally with multifocal airspace consolidation, most severe in the lower lobe regions bilaterally.  Though the study was interpreted as suspicious for multifocal pneumonia a pattern of irregular CHF would appear the same. Labs reveal elevated troponin levels as well as lactic acid of 2.2.  White count is normal and serum creatinine is 1.62 with potassium of 5.3.Baseline serum creatinine is 1.3  Patient was admitted for management of CHF.  Cardiology was consulted.  Diuretics were initiated.  He initially had improvement on his respiratory status being able to decrease his O2 intake to 2 L/min.  Pulmonary critical care was consulted on 08 December 2019.  For the details please refer to the consultation note.  The question was whether the patient had an infectious process or perhaps metastatic lung disease.  It was our impression of the patient's major issue was that of congestive heart failure given that his EF was 30 to 35% by echocardiogram.  Echocardiogram also showed mitral regurgitation which was moderate.  Of note this did not correlate with a cardiac murmur in the patient, in retrospect, raising the question of whether this may have been acute mitral regurgitation.  The  patient developed progressive pleural effusions and a thoracentesis was performed on the right pleural effusion (largest) on 08 December 2019.  Patient tolerated the procedure well with no evidence of pneumothorax following the right thoracentesis.  The effusion was transudate if consistent with CHF.  The  patient initially did well postprocedure noting relief from dyspnea.  At around 3:30 PM however the patient developed sudden increase in dyspnea and developed exam consistent with flash pulmonary edema with rales throughout his whole entire chest.  Bedside ultrasound with [facility] showed that there was no evidence of pneumothorax on the right.  The imaging was consistent with pulmonary edema in the lung with very prominent B-lines.  It was determined that the patient would be best taken care of in the SDU/ICU and that aggressive diuresis and likely inotrope support was going to be necessary.  Upon arrival to the SDU/ICU the patient rested upon transfer to the ICU bed.  CODE BLUE was called immediately.  CPR was instituted immediately.  The patient had full ACLS protocol instituted.  Intubation was difficult due to very copious amounts of pulmonary edema which was subsequently followed with hemoptysis.  Intubation was successful after several attempts.  For the details of the CODE BLUE documentation please see the CODE BLUE sheet.  The patient underwent full ACLS protocol for approximately 35 minutes without significant hemodynamic response.  Patient was pronounced dead at 1636 hrs.   Pertinent Labs and Studies  Significant Diagnostic Studies DG Chest 2 View  Result Date: 12/08/2019 CLINICAL DATA:  Cough and congestion EXAM: CHEST - 2 VIEW COMPARISON:  2019-12-21 FINDINGS: Cardiac shadow is stable. Patchy increasing infiltrates are noted particularly in the right upper lobe. Increasing bilateral effusions are noted. Central vascular congestion is again seen with interstitial edema. IMPRESSION: Changes of CHF stable from the prior exam. Increasing effusions are noted as well as patchy infiltrates bilaterally particularly in the right upper lobe. Electronically Signed   By: Alcide Clever M.D.   On: 12/08/2019 13:55   CT Angio Chest PE W and/or Wo Contrast  Result Date: 12-21-19 CLINICAL DATA:  Shortness  of breath decreased oxygen saturation EXAM: CT ANGIOGRAPHY CHEST WITH CONTRAST TECHNIQUE: Multidetector CT imaging of the chest was performed using the standard protocol during bolus administration of intravenous contrast. Multiplanar CT image reconstructions and MIPs were obtained to evaluate the vascular anatomy. CONTRAST:  60mL OMNIPAQUE IOHEXOL 350 MG/ML SOLN COMPARISON:  Chest radiograph Dec 21, 2019 FINDINGS: Cardiovascular: There is no demonstrable pulmonary embolus. There is no thoracic aortic aneurysm. No dissection is evident; note that the contrast bolus in the aorta is not sufficient for potential dissection assessment. There are scattered foci calcification in the visualized great vessels. There are foci aortic atherosclerosis. There are multiple foci of coronary artery calcification. There is no appreciable pericardial thickening. Minimal pericardial effusion may be within physiologic range. Mediastinum/Nodes: Wound visualized thyroid appears unremarkable. There are scattered subcentimeter mediastinal lymph nodes. There is a subcarinal lymph node measuring 1.0 x 1.0 cm. No esophageal lesions are appreciable. Lungs/Pleura: There are moderate free-flowing pleural effusions bilaterally. There is airspace consolidation in each lower lobe. There is airspace opacity in the right upper lobe centrally. There is airspace opacity in the posterior segment of the left upper lobe as well. There are multiple nodular opacities throughout the portions of lung which are not involved with consolidation or effusion. Nodular opacities range in size from as small as 2 mm to as large as 6 mm. These nodular opacities are seen  diffusely throughout the upper lobes CT may well be present in other areas which currently cannot be assessed due to pleural effusion and consolidation. Upper Abdomen: There is fatty infiltration near the fissure for the ligamentum teres. There is an apparent adenoma in the left adrenal measuring 1.0 x  0.7 cm. Visualized upper abdominal structures otherwise appear unremarkable. Musculoskeletal: No blastic or lytic bone lesions. No chest wall lesions evident. Review of the MIP images confirms the above findings. IMPRESSION: 1. No demonstrable pulmonary embolus. No thoracic aortic aneurysm. No dissection evident with note that the contrast bolus is not sufficient for dissection assessment. There is aortic atherosclerosis as well as multiple foci of coronary artery calcification and scattered foci of great vessel calcification. 2. Moderate free-flowing pleural effusions bilaterally with multifocal airspace consolidation, most severe in the lower lobe regions bilaterally. Suspect multifocal pneumonia. 3. Multiple nodular opacities throughout the aerated portions of the lung ranging in size from 2-6 mm. Concern for potential neoplastic etiology given these multiple noncalcified nodular lesions. 4. Borderline prominent subcarinal lymph node. Other subcentimeter lymph nodes noted throughout the mediastinum. 5.  Benign left adrenal adenoma. Aortic Atherosclerosis (ICD10-I70.0). Electronically Signed   By: Bretta Bang III M.D.   On: 12/03/2019 08:13   US RENAL  Result Date: 12/08/2019 CLINICAL DATA:  Chronic renal disease. Associated acute renal disease. EXAM: RENAL / URINARY TRACT ULTRASOUND COMPLETE COMPARISON:  CT chest 12/03/2019. FINDINGS: Right Kidney: Renal measurements: 9.6 x 4.7 x 5.5 cm = volume: 124 mL . Echogenicity within normal limits. No mass or hydronephrosis visualized. Left Kidney: Renal measurements: 9.9 x 5.4 x 4.9 cm = volume: 138 mL. Echogenicity within normal limits. No mass or hydronephrosis visualized. Trace amount noted about the lower pole left kidney. Bladder: Appears normal for degree of bladder distention. Bilateral ureteral jets. Prostate is prominent. Other: 1.  Bilateral pleural effusions. IMPRESSION: 1. Trace amount of fluid noted about the lower pole left kidney. Kidneys are  otherwise unremarkable. No hydronephrosis. Bilateral ureteral jets noted. 2.  Prostate is prominent.  No bladder distention. Electronically Signed   By: Maisie Fus  Register   On: 12/08/2019 13:45   DG Chest Port 1 View  Result Date: Dec 24, 2019 CLINICAL DATA:  64 year old male status post right-sided thoracentesis EXAM: PORTABLE CHEST 1 VIEW COMPARISON:  CT a chest 11/28/2019 FINDINGS: Significant interval reduction in volume of right-sided pleural effusion. Persistent moderate left pleural effusion. No evidence of pneumothorax. Diffuse bilateral interstitial and airspace opacities in a predominantly perihilar pattern. No significant vascular congestion. IMPRESSION: No evidence of pneumothorax following right-sided thoracentesis. Resolved right pleural effusion. Persistent moderate left pleural effusion. Electronically Signed   By: Malachy Moan M.D.   On: 2019-12-24 13:59   DG Chest Portable 1 View  Result Date: 11/30/2019 CLINICAL DATA:  Shortness of breath. EXAM: PORTABLE CHEST 1 VIEW COMPARISON:  None. FINDINGS: The heart is mildly enlarged. The mediastinal and hilar contours are grossly normal. Increased interstitial markings and Kerley B lines consistent with interstitial pulmonary edema. Small left pleural effusion and bibasilar atelectasis. The bony thorax is intact. IMPRESSION: CHF with small left effusion and bibasilar atelectasis. Electronically Signed   By: Rudie Meyer M.D.   On: 11/13/2019 05:46   ECHOCARDIOGRAM COMPLETE  Result Date: 11/29/2019    ECHOCARDIOGRAM REPORT   Patient Name:   MAZI BRAILSFORD Date of Exam: 11/27/2019 Medical Rec #:  [id number]      Height:       68.0 in Accession #:    [id number]  Weight:       130.0 lb Date of Birth:  04-10-1956      BSA:          1.702 m Patient Age:    63 years       BP:           95/75 mmHg Patient Gender: M              HR:           94 bpm. Exam Location:  [facility] Procedure: 2D Echo, Color Doppler and Cardiac Doppler Indications:     I50.9  Congestive Heart Failure  History:         Patient has no prior history of Echocardiogram examinations.                  Risk Factors:Hypertension and Diabetes.  Sonographer:     Humphrey Rolls RDCS (AE) Referring Phys:  [id number] Lucile Shutters Diagnosing Phys: Marcina Millard MD  Sonographer Comments: Suboptimal parasternal window. IMPRESSIONS  1. Left ventricular ejection fraction, by estimation, is 35 to 40%. The left ventricle has moderately decreased function. The left ventricle has no regional wall motion abnormalities. Left ventricular diastolic parameters were normal.  2. Right ventricular systolic function is normal. The right ventricular size is normal.  3. The mitral valve is normal in structure. Moderate mitral valve regurgitation. No evidence of mitral stenosis.  4. Tricuspid valve regurgitation is mild to moderate.  5. The aortic valve is normal in structure. Aortic valve regurgitation is not visualized. No aortic stenosis is present.  6. The inferior vena cava is normal in size with greater than 50% respiratory variability, suggesting right atrial pressure of 3 mmHg. FINDINGS  Left Ventricle: Left ventricular ejection fraction, by estimation, is 35 to 40%. The left ventricle has moderately decreased function. The left ventricle has no regional wall motion abnormalities. The left ventricular internal cavity size was normal in size. There is no left ventricular hypertrophy. Left ventricular diastolic parameters were normal. Right Ventricle: The right ventricular size is normal. No increase in right ventricular wall thickness. Right ventricular systolic function is normal. Left Atrium: Left atrial size was normal in size. Right Atrium: Right atrial size was normal in size. Pericardium: There is no evidence of pericardial effusion. Mitral Valve: The mitral valve is normal in structure. Normal mobility of the mitral valve leaflets. Moderate mitral valve regurgitation. No evidence of mitral valve stenosis.  MV peak gradient, 6.7 mmHg. The mean mitral valve gradient is 3.0 mmHg. Tricuspid Valve: The tricuspid valve is normal in structure. Tricuspid valve regurgitation is mild to moderate. No evidence of tricuspid stenosis. Aortic Valve: The aortic valve is normal in structure. Aortic valve regurgitation is not visualized. No aortic stenosis is present. Aortic valve mean gradient measures 2.0 mmHg. Aortic valve peak gradient measures 3.4 mmHg. Aortic valve area, by VTI measures 2.26 cm. Pulmonic Valve: The pulmonic valve was normal in structure. Pulmonic valve regurgitation is not visualized. No evidence of pulmonic stenosis. Aorta: The aortic root is normal in size and structure. Venous: The inferior vena cava is normal in size with greater than 50% respiratory variability, suggesting right atrial pressure of 3 mmHg. IAS/Shunts: No atrial level shunt detected by color flow Doppler.  LEFT VENTRICLE PLAX 2D LVIDd:         4.43 cm  Diastology LVIDs:         3.58 cm  LV e' lateral:   7.29 cm/s LV PW:  0.79 cm  LV E/e' lateral: 15.9 LV IVS:        0.84 cm  LV e' medial:    6.20 cm/s LVOT diam:     1.90 cm  LV E/e' medial:  18.7 LV SV:         33 LV SV Index:   19 LVOT Area:     2.84 cm  RIGHT VENTRICLE RV Basal diam:  2.77 cm LEFT ATRIUM             Index       RIGHT ATRIUM           Index LA diam:        3.90 cm 2.29 cm/m  RA Area:     13.50 cm LA Vol (A2C):   62.1 ml 36.49 ml/m RA Volume:   31.10 ml  18.27 ml/m LA Vol (A4C):   51.5 ml 30.26 ml/m LA Biplane Vol: 63.1 ml 37.08 ml/m  AORTIC VALVE                   PULMONIC VALVE AV Area (Vmax):    2.08 cm    PV Vmax:       0.71 m/s AV Area (Vmean):   1.85 cm    PV Vmean:      48.100 cm/s AV Area (VTI):     2.26 cm    PV VTI:        0.097 m AV Vmax:           92.40 cm/s  PV Peak grad:  2.0 mmHg AV Vmean:          68.700 cm/s PV Mean grad:  1.0 mmHg AV VTI:            0.144 m AV Peak Grad:      3.4 mmHg AV Mean Grad:      2.0 mmHg LVOT Vmax:         67.90 cm/s  LVOT Vmean:        44.800 cm/s LVOT VTI:          0.115 m LVOT/AV VTI ratio: 0.80  AORTA Ao Root diam: 2.80 cm MITRAL VALVE MV Area (PHT): 8.43 cm     SHUNTS MV Peak grad:  6.7 mmHg     Systemic VTI:  0.12 m MV Mean grad:  3.0 mmHg     Systemic Diam: 1.90 cm MV Vmax:       1.29 m/s MV Vmean:      73.6 cm/s MV Decel Time: 90 msec MV E velocity: 116.00 cm/s MV A velocity: 75.40 cm/s MV E/A ratio:  1.54 Marcina Millard MD Electronically signed by Marcina Millard MD Signature Date/Time: 12/10/2019/4:52:16 PM    Final    US THORACENTESIS ASP PLEURAL SPACE W/IMG GUIDE  Result Date: 01/04/2020 INDICATION: 64 year old male with large right-sided pleural effusion EXAM: ULTRASOUND GUIDED RIGHT THORACENTESIS MEDICATIONS: None. COMPLICATIONS: None immediate. PROCEDURE: An ultrasound guided thoracentesis was thoroughly discussed with the patient and questions answered. The benefits, risks, alternatives and complications were also discussed. The patient understands and wishes to proceed with the procedure. Written consent was obtained. Ultrasound was performed to localize and mark an adequate pocket of fluid in the right chest. The area was then prepped and draped in the normal sterile fashion. 1% Lidocaine was used for local anesthesia. Under ultrasound guidance a 6 Fr Safe-T-Centesis catheter was introduced. Thoracentesis was performed. The catheter was removed and a dressing applied. FINDINGS: A total of  approximately 1500 mL of golden colored pleural fluid was removed. Samples were sent to the laboratory as requested by the clinical team. IMPRESSION: Successful ultrasound guided right thoracentesis yielding 1.5 L of pleural fluid. Electronically Signed   By: Malachy Moan M.D.   On: 01/05/2020 14:02    Microbiology Recent Results (from the past 240 hour(s))  SARS Coronavirus 2 by RT PCR (hospital order, performed in [facility] hospital lab) Nasopharyngeal Nasopharyngeal Swab     Status: None   Collection  Time: January 04, 2020  5:25 AM   Specimen: Nasopharyngeal Swab  Result Value Ref Range Status   SARS Coronavirus 2 NEGATIVE NEGATIVE Final    Comment: (NOTE) SARS-CoV-2 target nucleic acids are NOT DETECTED.  The SARS-CoV-2 RNA is generally detectable in upper and lower respiratory specimens during the acute phase of infection. The lowest concentration of SARS-CoV-2 viral copies this assay can detect is 250 copies / mL. A negative result does not preclude SARS-CoV-2 infection and should not be used as the sole basis for treatment or other patient management decisions.  A negative result may occur with improper specimen collection / handling, submission of specimen other than nasopharyngeal swab, presence of viral mutation(s) within the areas targeted by this assay, and inadequate number of viral copies (<250 copies / mL). A negative result must be combined with clinical observations, patient history, and epidemiological information.  Fact Sheet for Patients:   BoilerBrush.com.cy  Fact Sheet for Healthcare Providers: https://pope.com/  This test is not yet approved or  cleared by the [place] FDA and has been authorized for detection and/or diagnosis of SARS-CoV-2 by FDA under an Emergency Use Authorization (EUA).  This EUA will remain in effect (meaning this test can be used) for the duration of the COVID-19 declaration under Section 564(b)(1) of the Act, 21 U.S.C. section 360bbb-3(b)(1), unless the authorization is terminated or revoked sooner.  Performed at [facility], [address] [place], [place] [address]   Blood culture (routine x 2)     Status: None (Preliminary result)   Collection Time: 01/04/20  5:54 AM   Specimen: BLOOD  Result Value Ref Range Status   Specimen Description BLOOD LEFT AC  Final   Special Requests   Final    BOTTLES DRAWN AEROBIC AND ANAEROBIC Blood Culture results may not be optimal due to  an excessive volume of blood received in culture bottles   Culture   Final    NO GROWTH 4 DAYS Performed at [facility], [address] [place], [place] [address]    Report Status PENDING  Incomplete  Blood culture (routine x 2)     Status: None (Preliminary result)   Collection Time: 2020/01/04  6:13 AM   Specimen: BLOOD  Result Value Ref Range Status   Specimen Description BLOOD RIGHT HAND  Final   Special Requests   Final    BOTTLES DRAWN AEROBIC AND ANAEROBIC Blood Culture results may not be optimal due to an inadequate volume of blood received in culture bottles   Culture   Final    NO GROWTH 4 DAYS Performed at [facility], [address] [place], [place] [address]    Report Status PENDING  Incomplete  Expectorated sputum assessment w rflx to resp cult     Status: None   Collection Time: 12/06/19  4:23 PM   Specimen: Sputum  Result Value Ref Range Status   Specimen Description SPUTUM  Final   Special Requests NONE  Final  Sputum evaluation   Final    Sputum specimen not acceptable for testing.  Please recollect.   RESULT CALLED TO, READ BACK BY AND VERIFIED WITH: Hyman Hopes 12/06/19 1712 KLW Performed at [facility], [address] [place], [place] [address]    Report Status 12/06/2019 FINAL  Final  Expectorated sputum assessment w rflx to resp cult     Status: None   Collection Time: 12/06/19 10:00 PM   Specimen: SPU  Result Value Ref Range Status   Specimen Description SPUTUM  Final   Special Requests NONE  Final   Sputum evaluation   Final    THIS SPECIMEN IS ACCEPTABLE FOR SPUTUM CULTURE Performed at [facility], [address] [place], [place] [address]    Report Status 12/07/2019 FINAL  Final  Culture, respiratory     Status: None (Preliminary result)   Collection Time: 12/06/19 10:00 PM   Specimen: SPU  Result Value Ref Range Status   Specimen Description   Final    SPUTUM Performed at [facility],  [address], [place], [place] [address]    Special Requests   Final    NONE Reflexed from [id number] Performed at [facility], [address] [place], [place] [address]    Gram Stain   Final    RARE WBC PRESENT, PREDOMINANTLY MONONUCLEAR NO ORGANISMS SEEN Performed at [facility], [address] [place], [place] [address]    Culture FEW YEAST  Final   Report Status PENDING  Incomplete    Lab Basic Metabolic Panel: Recent Labs  Lab 11/15/2019 0525 12/06/19 0043 12/07/19 0551 12/08/19 0410 December 18, 2019 0553  NA 136 137 136 135 135  K 5.3* 4.4 4.3 3.9 4.0  CL 98 98 95* 95* 95*  CO2 26 26 25 27 28   GLUCOSE 237* 165* 187* 177* 232*  BUN 29* 34* 43* 51* 54*  CREATININE 1.62* 1.56* 1.58* 1.67* 1.55*  CALCIUM 8.5* 8.5* 8.7* 8.5* 8.7*  MG  --   --  2.1  --   --   PHOS  --   --  4.9*  --   --    Liver Function Tests: Recent Labs  Lab 11/11/2019 0525 12/07/19 0551 12/08/19 0410 12/18/19 0553  AST 27 20 21 21   ALT 26 23 22 23   ALKPHOS 85 80 70 74  BILITOT 1.1 1.2 1.1 1.0  PROT 6.3* 6.8 6.3* 6.6  ALBUMIN 3.3* 3.6 3.4* 3.4*   No results for input(s): LIPASE, AMYLASE in the last 168 hours. No results for input(s): AMMONIA in the last 168 hours. CBC: Recent Labs  Lab 11/23/2019 0525 12/06/19 0043 12/07/19 0551 12/18/2019 1033  WBC 8.4 12.5* 10.4 10.0  NEUTROABS  --   --  9.4* 8.5*  HGB 10.8* 10.5* 10.5* 10.7*  HCT 33.1* 31.9* 31.4* 31.9*  MCV 89.9 90.9 89.5 88.9  PLT 268 268 301 305   Cardiac Enzymes: No results for input(s): CKTOTAL, CKMB, CKMBINDEX, TROPONINI in the last 168 hours. Sepsis Labs: Recent Labs  Lab 12/03/2019 0525 12/01/2019 0554 11/30/2019 0728 12/06/19 0043 12/07/19 0551 2019-12-18 1033  PROCALCITON <0.10  --   --  <0.10  --   --   WBC 8.4  --   --  12.5* 10.4 10.0  LATICACIDVEN  --  2.2* 2.2*  --   --   --     Procedures/Operations   Echocardiogram, 05 December 2019  Thoracentesis, right, 08 December 2019  CPR-endotracheal intubation, 08 December 2019   C. Danice Goltz, MD [facility] 12/11/19, 7:02 PM

## 2020-01-08 NOTE — Progress Notes (Signed)
[facility]  ROUNDING NOTE   Subjective:   Wife at bedside. Patient continues to have hemoptysis.   Objective:  Vital signs in last 24 hours:  Temp:  [98.2 F (36.8 C)-98.5 F (36.9 C)] 98.4 F (36.9 C) (07/02 0742) Pulse Rate:  [102-107] 102 (07/02 0742) Resp:  [18-20] 20 (07/02 0742) BP: (108-126)/(73-81) 118/78 (07/02 0742) SpO2:  [91 %-96 %] 93 % (07/02 0742) Weight:  [69.3 kg] 69.3 kg (07/02 0441)  Weight change: -0.586 kg Filed Weights   12/07/19 0500 12/08/19 0423 12/13/2019 0441  Weight: 70.1 kg 69.8 kg 69.3 kg    Intake/Output: I/O last 3 completed shifts: In: 3 [I.V.:3] Out: 1150 [Urine:1150]   Intake/Output this shift:  No intake/output data recorded.  Physical Exam: General: NAD,   Head: Normocephalic, atraumatic. Moist oral mucosal membranes  Eyes: Anicteric, PERRL  Neck: Supple, trachea midline  Lungs:  Bilateral wheezing  Heart: Regular rate and rhythm  Abdomen:  Soft, nontender,   Extremities: no peripheral edema.  Neurologic: Nonfocal, moving all four extremities  Skin: No lesions        Basic Metabolic Panel: Recent Labs  Lab 12-22-19 0525 12-22-19 0525 12/06/19 0043 12/06/19 0043 12/07/19 0551 12/08/19 0410 12/20/2019 0553  NA 136  --  137  --  136 135 135  K 5.3*  --  4.4  --  4.3 3.9 4.0  CL 98  --  98  --  95* 95* 95*  CO2 26  --  26  --  25 27 28   GLUCOSE 237*  --  165*  --  187* 177* 232*  BUN 29*  --  34*  --  43* 51* 54*  CREATININE 1.62*  --  1.56*  --  1.58* 1.67* 1.55*  CALCIUM 8.5*   < > 8.5*   < > 8.7* 8.5* 8.7*  MG  --   --   --   --  2.1  --   --   PHOS  --   --   --   --  4.9*  --   --    < > = values in this interval not displayed.    Liver Function Tests: Recent Labs  Lab December 22, 2019 0525 12/07/19 0551 12/08/19 0410 12/19/2019 0553  AST 27 20 21 21   ALT 26 23 22 23   ALKPHOS 85 80 70 74  BILITOT 1.1 1.2 1.1 1.0  PROT 6.3* 6.8 6.3* 6.6  ALBUMIN 3.3* 3.6 3.4* 3.4*   No results for input(s):  LIPASE, AMYLASE in the last 168 hours. No results for input(s): AMMONIA in the last 168 hours.  CBC: Recent Labs  Lab 12-22-19 0525 12/06/19 0043 12/07/19 0551 12/19/2019 1033  WBC 8.4 12.5* 10.4 10.0  NEUTROABS  --   --  9.4* 8.5*  HGB 10.8* 10.5* 10.5* 10.7*  HCT 33.1* 31.9* 31.4* 31.9*  MCV 89.9 90.9 89.5 88.9  PLT 268 268 301 305    Cardiac Enzymes: No results for input(s): CKTOTAL, CKMB, CKMBINDEX, TROPONINI in the last 168 hours.  BNP: Invalid input(s): POCBNP  CBG: Recent Labs  Lab 12/07/19 2116 12/08/19 0741 12/08/19 1643 12/08/19 2054 12/28/2019 0744  GLUCAP 164* 190* 209* 194* 68*    Microbiology: Results for orders placed or performed during the hospital encounter of Dec 22, 2019  SARS Coronavirus 2 by RT PCR (hospital order, performed in [facility] hospital lab) Nasopharyngeal Nasopharyngeal Swab     Status: None   Collection Time: 12-22-2019  5:25 AM   Specimen:  Nasopharyngeal Swab  Result Value Ref Range Status   SARS Coronavirus 2 NEGATIVE NEGATIVE Final    Comment: (NOTE) SARS-CoV-2 target nucleic acids are NOT DETECTED.  The SARS-CoV-2 RNA is generally detectable in upper and lower respiratory specimens during the acute phase of infection. The lowest concentration of SARS-CoV-2 viral copies this assay can detect is 250 copies / mL. A negative result does not preclude SARS-CoV-2 infection and should not be used as the sole basis for treatment or other patient management decisions.  A negative result may occur with improper specimen collection / handling, submission of specimen other than nasopharyngeal swab, presence of viral mutation(s) within the areas targeted by this assay, and inadequate number of viral copies (<250 copies / mL). A negative result must be combined with clinical observations, patient history, and epidemiological information.  Fact Sheet for Patients:   StrictlyIdeas.no  Fact Sheet for Healthcare  Providers: BankingDealers.co.za  This test is not yet approved or  cleared by the [place] FDA and has been authorized for detection and/or diagnosis of SARS-CoV-2 by FDA under an Emergency Use Authorization (EUA).  This EUA will remain in effect (meaning this test can be used) for the duration of the COVID-19 declaration under Section 564(b)(1) of the Act, 21 U.S.C. section 360bbb-3(b)(1), unless the authorization is terminated or revoked sooner.  Performed at [facility], [address] [place], [place] [address]   Blood culture (routine x 2)     Status: None (Preliminary result)   Collection Time: Dec 06, 2019  5:54 AM   Specimen: BLOOD  Result Value Ref Range Status   Specimen Description BLOOD LEFT AC  Final   Special Requests   Final    BOTTLES DRAWN AEROBIC AND ANAEROBIC Blood Culture results may not be optimal due to an excessive volume of blood received in culture bottles   Culture   Final    NO GROWTH 4 DAYS Performed at [facility], [address], [place], [place] [address]    Report Status PENDING  Incomplete  Blood culture (routine x 2)     Status: None (Preliminary result)   Collection Time: 12/06/2019  6:13 AM   Specimen: BLOOD  Result Value Ref Range Status   Specimen Description BLOOD RIGHT HAND  Final   Special Requests   Final    BOTTLES DRAWN AEROBIC AND ANAEROBIC Blood Culture results may not be optimal due to an inadequate volume of blood received in culture bottles   Culture   Final    NO GROWTH 4 DAYS Performed at [facility], [address] [place], [place] [address]    Report Status PENDING  Incomplete  Expectorated sputum assessment w rflx to resp cult     Status: None   Collection Time: 12/06/19  4:23 PM   Specimen: Sputum  Result Value Ref Range Status   Specimen Description SPUTUM  Final   Special Requests NONE  Final   Sputum evaluation   Final    Sputum specimen not acceptable for  testing.  Please recollect.   RESULT CALLED TO, READ BACK BY AND VERIFIED WITH: Caleen Jobs 12/06/19 1712 KLW Performed at [facility], [address] [place],  [address]    Report Status 12/06/2019 FINAL  Final  Expectorated sputum assessment w rflx to resp cult     Status: None   Collection Time: 12/06/19 10:00 PM   Specimen: SPU  Result Value Ref Range Status   Specimen Description SPUTUM  Final  Special Requests NONE  Final   Sputum evaluation   Final    THIS SPECIMEN IS ACCEPTABLE FOR SPUTUM CULTURE Performed at [facility], [address] [place], [place] [address]    Report Status 12/07/2019 FINAL  Final  Culture, respiratory     Status: None (Preliminary result)   Collection Time: 12/06/19 10:00 PM   Specimen: SPU  Result Value Ref Range Status   Specimen Description   Final    SPUTUM Performed at [facility], [address], [place], [place] [address]    Special Requests   Final    NONE Reflexed from [id number] Performed at [facility], [address] [place], [place] [address]    Gram Stain   Final    RARE WBC PRESENT, PREDOMINANTLY MONONUCLEAR NO ORGANISMS SEEN    Culture   Final    CULTURE REINCUBATED FOR BETTER GROWTH Performed at [facility], [place] [address] [place], [place] [address]    Report Status PENDING  Incomplete    Coagulation Studies: No results for input(s): LABPROT, INR in the last 72 hours.  Urinalysis: Recent Labs    12/08/19 1312  COLORURINE YELLOW  LABSPEC 1.025  PHURINE 5.5  GLUCOSEU NEGATIVE  HGBUR NEGATIVE  BILIRUBINUR SMALL*  KETONESUR 15*  PROTEINUR 30*  NITRITE NEGATIVE  LEUKOCYTESUR NEGATIVE      Imaging: DG Chest 2 View  Result Date: 12/08/2019 CLINICAL DATA:  Cough and congestion EXAM: CHEST - 2 VIEW COMPARISON:  12/13/2019 FINDINGS: Cardiac shadow is stable. Patchy increasing infiltrates are noted particularly in the right upper lobe. Increasing bilateral  effusions are noted. Central vascular congestion is again seen with interstitial edema. IMPRESSION: Changes of CHF stable from the prior exam. Increasing effusions are noted as well as patchy infiltrates bilaterally particularly in the right upper lobe. Electronically Signed   By: Inez Catalina M.D.   On: 12/08/2019 13:55   US RENAL  Result Date: 12/08/2019 CLINICAL DATA:  Chronic renal disease. Associated acute renal disease. EXAM: RENAL / URINARY TRACT ULTRASOUND COMPLETE COMPARISON:  CT chest Dec 09, 2019. FINDINGS: Right Kidney: Renal measurements: 9.6 x 4.7 x 5.5 cm = volume: 124 mL . Echogenicity within normal limits. No mass or hydronephrosis visualized. Left Kidney: Renal measurements: 9.9 x 5.4 x 4.9 cm = volume: 138 mL. Echogenicity within normal limits. No mass or hydronephrosis visualized. Trace amount noted about the lower pole left kidney. Bladder: Appears normal for degree of bladder distention. Bilateral ureteral jets. Prostate is prominent. Other: 1.  Bilateral pleural effusions. IMPRESSION: 1. Trace amount of fluid noted about the lower pole left kidney. Kidneys are otherwise unremarkable. No hydronephrosis. Bilateral ureteral jets noted. 2.  Prostate is prominent.  No bladder distention. Electronically Signed   By: Marcello Moores  Register   On: 12/08/2019 13:45     Medications:   . sodium chloride     . benzonatate  200 mg Oral TID  . budesonide (PULMICORT) nebulizer solution  0.25 mg Nebulization BID  . enoxaparin (LOVENOX) injection  40 mg Subcutaneous Q24H  . fluticasone  2 spray Each Nare Daily  . guaiFENesin  1,200 mg Oral BID  . insulin aspart  0-5 Units Subcutaneous QHS  . insulin aspart  0-9 Units Subcutaneous TID WC  . levalbuterol  1.25 mg Nebulization Q8H  . multivitamin with minerals  1 tablet Oral Daily  . pantoprazole  40 mg Oral Daily  . sodium chloride flush  3 mL Intravenous Q12H   sodium chloride, acetaminophen **  OR** acetaminophen, guaiFENesin, ondansetron **OR**  ondansetron (ZOFRAN) IV, sodium chloride flush, zolpidem  Assessment/ Plan:  Mr. Terrance Watson is a 64 y.o. white male with diabetes mellitus type II, hypertension, hyperlipidemia, diabetic retinopathy and diabetic neuropathy who was admitted to [facility] on December 14, 2019 for Respiratory failure with hypoxia (HCC) [J96.91] Acute respiratory failure with hypoxia (HCC) [J96.01]  1. Acute renal failure on chronic kidney disease stage IIIA with proteinuria : baseline creatinine 1.3, GFR of 56 on 06/09/2019.  Acute renal failure seems secondary to overdiuretics Chronic kidney disease work up: renal ultrasound, SPEP/UPEP, urine studies, ANA, ANCA, anti-GBM. Differential discussed with patient. Discussed role of a kidney biopsy.   2. Hypertension: not currently on antihypertensive agents. Discussed role of ACE-I/ARB with patient. Discussed role of diuretics.  New dialysis of systolic congestive heart failure.   3. Diabetes mellitus type II with chronic kidney disease: noninsulin dependent. On metformin. Not on a SGLT-2 inhibitor Hemoglobin A1c 7.2%.  4. Pleural effusions: bilateral  - right thoracentesis scheduled for today.    LOS: 4 Kallista Pae 7/2/202111:28 AM

## 2020-01-08 NOTE — Significant Event (Signed)
I was notified via secure chat by patient's nurse at around 330 that the patient was having increased work of breathing.  He had had thoracentesis earlier.  Postprocedure chest x-ray showed no pneumothorax.  I arrived to the bedside and evaluated the patient emergently.  Bedside lung ultrasound revealed no evidence of pneumothorax on right lung.  His lung sounds were symmetrical however the patient had rales throughout his whole entire lung fields.  He also had cough of frothy pink sputum.  Orders were written for the patient to be transferred to SCU/ICU for management of pulmonary edema with diuretics and inotropic assistance.  Of note the pleural fluid was consistent with a transudate likely due to CHF.  Patient being transferred due to decompensation of CHF.  I discussed the case with the patient's hospitalist and Dr. Juliann Pares who was consulting cardiologist.  Patient be transferred to the SCU/ICU.  Gailen Shelter, MD [place] [facility]   *This note was dictated using voice recognition software/Dragon.  Despite best efforts to proofread, errors can occur which can change the meaning.  Any change was purely unintentional.

## 2020-01-08 NOTE — Progress Notes (Signed)
[place] responded to code blue in ICU and was already at ICU RN station.  Pt.'s wife shocked, tearful, overwhelmed.  [place] sat w/her outside rm. and offered supportive presence and prayer support.  Wife very concerned re: need to inform her two dtrs. of situation, and as it became clear that pt. was not able to be revived she felt especially overwhelmed by the thought of having to be the one to tell dtrs.  Wife's friend arrived and met her in [place]; wife remained in [place] until MD asked [place] to bring her back to ICU to convey news that pt. had died.  Wife appeared to still be in shock --> returned to [place] and met her two pastors there.  Wife well supported when [facility] excused himself.  Wife grateful for [facility] support and presence until friends and pastors arrived.  [place] attempted to follow-up a short while later but chapel empty.

## 2020-01-08 NOTE — Progress Notes (Signed)
SLP Cancellation Note  Patient Details Name: Terrance Watson MRN: [id number] DOB: Jul 28, 1955   Cancelled treatment:       Reason Eval/Treat Not Completed: Fatigue/lethargy limiting ability to participate;Patient not medically ready (chart reviewed; consulted NSG and met w/ pt/Wife in room). Per NSG report, pt had recently returned from a thoracentesis. He was lying in bed on his side w/ [place] O2 support and c/o difficulty breathing. Wife had called NSG to room. Pt was assisted in sitting more upright w/ pillows to support by this SLP and NSG who was in the room. MD arrived at room to assess. Due to pt's presentation, MD ordered for pt to be transferred to CCU.  Had discussed general swallowing w/ Wife just prior to this, and general aspiration precautions. Recommended ice chips and oral care tonight. Wife stated pt had not been wanting much to eat. Informed her that ST services (this SLP) would f/u again tomorrow. Wife agreed.  Addendum: pt had a further decline in medical status upon transfer to CCU. ST services will monitor pt's status for appropriateness for BSE.    Orinda Kenner, MS, CCC-SLP Pre[facility]ha Benjamin Jan 05, 2020, 4:42 PM

## 2020-01-08 NOTE — Code Documentation (Addendum)
PATIENT NAME: Terrance Watson MEDICAL RECORD NUMBER: [id number] Birthday: 07-20-1955  Age: 64 y.o. Admit Date: 12-07-2019   Provider: Gailen Shelter, MD  Indication: Cardiac arrest  Technical Description:   CPR performance duration: 35 minutes   was defibrillation or cardioversion used ?  Yes  Was external pacer placed ?  Yes  Was patient intubated pre/post CPR ?  During CPR   was transvenous pacer placed ?  No  Medications Administered Include      Yes/no Amiodarone  yes, 300 mg IV  Atropine  no  Calcium  no  Epinephrine  yes, x9  Lidocaine  no  Magnesium  yes, 1 g  Norepinephrine   Dopamine  GTT, 10 mcg  Sodium bicarbonate  yes, x2  Vasopressin    Evaluation  Final Status - Was patient successfully resuscitated ?  No  If successfully resuscitated - what is current rhythm ? N/A If successfully resuscitated - what is current hemodynamic status ? N/A  Miscellaneous Information Patient was being transferred from the floor to stepdown unit.  On arrival to the stepdown unit the patient went into cardiac arrest upon transfer to bed.  CODE BLUE called at 1601.  Patient was bagged by RT and compressions started.  Received epinephrine due to pulselessness.  Attempts to intubation were difficult due to copious pulmonary edema.  Patient was subsequently intubated with a #7.5 ET tube.  Adequate CPR was maintained throughout.  Patient had some bursts of V. tach and received cardioversion x3 different occasions he also received amiodarone load.  Patient continued to deteriorate rhythm wise.  And subsequently developed PEA.  The patient would not maintain spontaneous circulation despite all attempts.  CODE BLUE was terminated at 1636 hrs. which was time of death.   Gailen Shelter, MD [place] [facility] 7/2/20216:37 PM   *This note was dictated using voice recognition software/Dragon.  Despite best efforts to proofread, errors can occur which can change the meaning.  Any change was  purely unintentional.

## 2020-01-08 NOTE — Progress Notes (Signed)
Inpatient Diabetes Program Recommendations  AACE/ADA: New Consensus Statement on Inpatient Glycemic Control (2015)  Target Ranges:  Prepandial:   less than 140 mg/dL      Peak postprandial:   less than 180 mg/dL (1-2 hours)      Critically ill patients:  140 - 180 mg/dL   Lab Results  Component Value Date   GLUCAP 236 (H) 12/13/2019   HGBA1C 7.2 (H) 2019/12/28    Review of Glycemic Control Results for Terrance Watson, Terrance Watson (MRN [id number]) as of 12/25/2019 11:15  Ref. Range 12/07/2019 21:16 12/08/2019 07:41 12/08/2019 16:43 12/08/2019 20:54 12/22/2019 07:44  Glucose-Capillary Latest Ref Range: 70 - 99 mg/dL 450 (H) 388 (H) 828 (H) 194 (H) 236 (H)   Diabetes history: DM2 Outpatient Diabetes medications: Glucotrol 5 mg qd + Metformin 500 mg qd Current orders for Inpatient glycemic control: Novolog sensitive correction tid + hs 0-5 units  Inpatient Diabetes Program Recommendations:   While oral DM medications held: -Levemir 8 units qd (0.1 unit/kg x 69.3 kg = 7 units)  Thank you, Darel Hong E. Yicel Shannon, RN, MSN, CDE  Diabetes Coordinator Inpatient Glycemic Control Team Team Pager [phone] (8am-5pm) 12/11/2019 11:17 AM

## 2020-01-08 NOTE — Progress Notes (Signed)
[facility] Cardiology    SUBJECTIVE: Patient appears to be in acute respiratory distress while I am at bedside he is on supplemental oxygen and possibly a nonrebreather he may benefit from BiPAP we have ordered milrinone to help with inotropic support and will also recommended Entresto and Coreg for heart failure management and care critical care treatment evaluation for lung disease and shortness of breath I think is warranted.  I do not recommend any invasive cardiac procedures at this point.  Hopefully with supplemental oxygen IV diuretics milrinone for inotropic support and Entresto and Coreg for heart failure management the patient may eventually improve   Vitals:   12-22-2019 1133 22-Dec-2019 1328 December 22, 2019 1346 12-22-19 1411  BP: 107/74 113/73 106/67 102/75  Pulse: (!) 104   98  Resp: 19     Temp: 98.1 F (36.7 C)   99 F (37.2 C)  TempSrc: Oral   Oral  SpO2: 94% 95% 97% 93%  Weight:      Height:         Intake/Output Summary (Last 24 hours) at 2019/12/22 1553 Last data filed at 12/22/2019 1100 Gross per 24 hour  Intake --  Output 425 ml  Net -425 ml      PHYSICAL EXAM  General: Well developed, well nourished, in acute respiratory distress HEENT:  Normocephalic and atramatic Neck:  No JVD.  Lungs: Diminished bilaterally to auscultation and percussion.  Diffuse rhonchi Heart: HRRR . Normal S1 and S2 without gallops or murmurs.  Abdomen: Bowel sounds are positive, abdomen soft and non-tender  Msk:  Back normal, normal gait. Normal strength and tone for age. Extremities: No clubbing, cyanosis or edema.   Neuro: Alert and oriented X 3. Psych:  Good affect, responds appropriately   LABS: Basic Metabolic Panel: Recent Labs    12/07/19 0551 12/07/19 0551 12/08/19 0410 12-22-2019 0553  NA 136   < > 135 135  K 4.3   < > 3.9 4.0  CL 95*   < > 95* 95*  CO2 25   < > 27 28  GLUCOSE 187*   < > 177* 232*  BUN 43*   < > 51* 54*  CREATININE 1.58*   < > 1.67* 1.55*  CALCIUM 8.7*   < >  8.5* 8.7*  MG 2.1  --   --   --   PHOS 4.9*  --   --   --    < > = values in this interval not displayed.   Liver Function Tests: Recent Labs    12/08/19 0410 December 22, 2019 0553  AST 21 21  ALT 22 23  ALKPHOS 70 74  BILITOT 1.1 1.0  PROT 6.3* 6.6  ALBUMIN 3.4* 3.4*   No results for input(s): LIPASE, AMYLASE in the last 72 hours. CBC: Recent Labs    12/07/19 0551 12-22-19 1033  WBC 10.4 10.0  NEUTROABS 9.4* 8.5*  HGB 10.5* 10.7*  HCT 31.4* 31.9*  MCV 89.5 88.9  PLT 301 305   Cardiac Enzymes: No results for input(s): CKTOTAL, CKMB, CKMBINDEX, TROPONINI in the last 72 hours. BNP: Invalid input(s): POCBNP D-Dimer: No results for input(s): DDIMER in the last 72 hours. Hemoglobin A1C: No results for input(s): HGBA1C in the last 72 hours. Fasting Lipid Panel: No results for input(s): CHOL, HDL, LDLCALC, TRIG, CHOLHDL, LDLDIRECT in the last 72 hours. Thyroid Function Tests: No results for input(s): TSH, T4TOTAL, T3FREE, THYROIDAB in the last 72 hours.  Invalid input(s): FREET3 Anemia Panel: Recent Labs    12/08/19  1053  VITAMINB12 905  FOLATE 19.0  FERRITIN 236  TIBC 316  IRON 25*    DG Chest 2 View  Result Date: 12/08/2019 CLINICAL DATA:  Cough and congestion EXAM: CHEST - 2 VIEW COMPARISON:  11/14/2019 FINDINGS: Cardiac shadow is stable. Patchy increasing infiltrates are noted particularly in the right upper lobe. Increasing bilateral effusions are noted. Central vascular congestion is again seen with interstitial edema. IMPRESSION: Changes of CHF stable from the prior exam. Increasing effusions are noted as well as patchy infiltrates bilaterally particularly in the right upper lobe. Electronically Signed   By: Alcide Clever M.D.   On: 12/08/2019 13:55   US RENAL  Result Date: 12/08/2019 CLINICAL DATA:  Chronic renal disease. Associated acute renal disease. EXAM: RENAL / URINARY TRACT ULTRASOUND COMPLETE COMPARISON:  CT chest 12/03/2019. FINDINGS: Right Kidney: Renal  measurements: 9.6 x 4.7 x 5.5 cm = volume: 124 mL . Echogenicity within normal limits. No mass or hydronephrosis visualized. Left Kidney: Renal measurements: 9.9 x 5.4 x 4.9 cm = volume: 138 mL. Echogenicity within normal limits. No mass or hydronephrosis visualized. Trace amount noted about the lower pole left kidney. Bladder: Appears normal for degree of bladder distention. Bilateral ureteral jets. Prostate is prominent. Other: 1.  Bilateral pleural effusions. IMPRESSION: 1. Trace amount of fluid noted about the lower pole left kidney. Kidneys are otherwise unremarkable. No hydronephrosis. Bilateral ureteral jets noted. 2.  Prostate is prominent.  No bladder distention. Electronically Signed   By: Maisie Fus  Register   On: 12/08/2019 13:45   DG Chest Port 1 View  Result Date: 2020/01/08 CLINICAL DATA:  64 year old male status post right-sided thoracentesis EXAM: PORTABLE CHEST 1 VIEW COMPARISON:  CT a chest 12/01/2019 FINDINGS: Significant interval reduction in volume of right-sided pleural effusion. Persistent moderate left pleural effusion. No evidence of pneumothorax. Diffuse bilateral interstitial and airspace opacities in a predominantly perihilar pattern. No significant vascular congestion. IMPRESSION: No evidence of pneumothorax following right-sided thoracentesis. Resolved right pleural effusion. Persistent moderate left pleural effusion. Electronically Signed   By: Malachy Moan M.D.   On: 01-08-2020 13:59   US THORACENTESIS ASP PLEURAL SPACE W/IMG GUIDE  Result Date: 08-Jan-2020 INDICATION: 64 year old male with large right-sided pleural effusion EXAM: ULTRASOUND GUIDED RIGHT THORACENTESIS MEDICATIONS: None. COMPLICATIONS: None immediate. PROCEDURE: An ultrasound guided thoracentesis was thoroughly discussed with the patient and questions answered. The benefits, risks, alternatives and complications were also discussed. The patient understands and wishes to proceed with the procedure. Written  consent was obtained. Ultrasound was performed to localize and mark an adequate pocket of fluid in the right chest. The area was then prepped and draped in the normal sterile fashion. 1% Lidocaine was used for local anesthesia. Under ultrasound guidance a 6 Fr Safe-T-Centesis catheter was introduced. Thoracentesis was performed. The catheter was removed and a dressing applied. FINDINGS: A total of approximately 1500 mL of golden colored pleural fluid was removed. Samples were sent to the laboratory as requested by the clinical team. IMPRESSION: Successful ultrasound guided right thoracentesis yielding 1.5 L of pleural fluid. Electronically Signed   By: Malachy Moan M.D.   On: 01-08-20 14:02     Echo mild to moderate reduced left ventricular function between 35 and 40%  TELEMETRY: Sinus rhythm nonspecific T2 changes:  ASSESSMENT AND PLAN:  Principal Problem:   Respiratory failure with hypoxia (HCC) Active Problems:   Diabetes mellitus without complication (HCC)   Elevated troponin   CKD (chronic kidney disease), stage III   Hyperkalemia   Acute  CHF (congestive heart failure) (HCC)   Pneumonia Respiratory distress/impending respiratory failure  Plan Patient's had a clinical deterioration in her respiratory status now appears to be in respiratory distress and use of accessory muscles he status post thoracentesis Initial brief evaluation by pulmonary suggested there was not a pneumothorax Consideration for worsening heart failure would recommend an IV diuretics Lasix 40 or 80 I started [employer] as well We have also ordered milrinone IV to be done in the stepdown unit to help with inotropic support His presentation is complex including significant interstitial lung disease pneumonia heart failure culminating in respiratory failure I agree with transferring to stepdown unit for more aggressive monitoring and care and critical care input Case was discussed with Dr. Jayme Cloud  critical care pulmonary as well as hospitalist  Alwyn Pea, MD 12-24-19 3:53 PM

## 2020-01-08 NOTE — Progress Notes (Signed)
PROGRESS NOTE   Terrance Watson  VFM:[id number]    DOB: 06-Oct-1955    DOA: 11/30/2019  PCP: Derinda Late, MD   I have briefly reviewed patients previous medical records in [facility]  Chief Complaint  Patient presents with  . 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  Reportedly treated for laryngitis with 2 rounds of antibiotics by PCP.  In the ED, in respiratory distress needed BiPAP.  Chest x-ray concerning for CHF.  CTA chest negative for PE, showed bilateral pleural effusion, multifocal airspace consolidation.  Admitted for acute systolic CHF and pneumonia. Cardiology, pulmonology and nephrology consulted.  On 7/2, I evaluated patient in the morning at which time he was awaiting pulmonary follow-up for bedside ultrasound followed by thoracentesis.  Later in the afternoon postthoracentesis, patient developed acute onset of dyspnea, I immediately went to bedside and evaluated patient along with PCCM.  Patient was transferred to stepdown unit for high flow nasal cannula oxygen, Lasix and possible milrinone drip.  I handed over care to the ICU team.  Subsequently I learned that he rapidly decompensated and demised.   Assessment & Plan:  Principal Problem:   Respiratory failure with hypoxia (HCC) Active Problems:   Diabetes mellitus without complication (HCC)   Elevated troponin   CKD (chronic kidney disease), stage III   Hyperkalemia   Acute CHF (congestive heart failure) (HCC)   Pneumonia    Acute systolic CHF: TTE showed LVEF of 35-40%.  Started on IV Lasix 40 mg twice daily.  Cardiology was consulted who recommended continuing IV diuresis.  No ARB/ACEI/due to AKI.  Beta-blockers not initiated due to acute CHF. Creatinine worsened to 1.7 from 1.5. IV Lasix  discontinued. Discussed in detail with cardiology, have requested pulmonology for thoracentesis. Will evaluate outpatient for ischemia work-up.? Viral myocarditis.  IV Lasix had to be held due to worsening creatinine.  Neurology consulted pulmonology who were questioning possible sequelae of post viral myocarditis, diuresis as tolerated, may need inotrope support and pulmonology consulted nephrologist.  Reassessed by cardiology at bedside on afternoon of 7/3 when patient decompensated, plan for IV Lasix, milrinone but unfortunately were unable to due to progressive decline and eventually demised.  Multifocal pneumonia/pleural effusions: Urine Legionella and pneumococcal antigen negative.  Sputum culture pending.  Blood cultures negative to date.  Empirically treated with IV ceftriaxone and azithromycin.  Reports tenacious mucus and unable to bring up, added Mucinex.  Will need follow-up imaging, chest x-ray were possibly CT chest in a couple of weeks to ensure resolution. Pulmonary consultation appreciated. Discussed with Dr. Patsey Berthold at bedside. Suspect atypical pneumonia-and as per recommendations discontinued antibiotics. Imaging findings suggestive of pulmonary edema, diuresis limited due to acute kidney injury and wondering if needs inotropic support for diuresis.  Nebulizations were added.  On 7/3, patient had ongoing dyspnea even with minimal activity such as speaking.  As agreed by multiple consultants, patient underwent right thoracentesis which was suggestive of transudative effusion.  Postprocedure chest x-ray was without pneumothorax.  Acute respiratory failure with hypoxia: Due to decompensated CHF and pneumonia.  Not on home oxygen PTA.  Briefly on BiPAP on arrival.  With IV diuresis and antibiotics, he was able to wean down to 2 L/min oxygen.  However on 7/3 afternoon after couple of hours of thoracentesis, RN  messaged via secure chat stating that patient had worsening dyspnea.  I proceeded to  bedside right away and met PCCM team at the bedside about the same time.  Bedside ultrasound did not show evidence of pneumothorax.  Clinical picture was suggestive of flash pulmonary edema.  Ordered IV Lasix 40 mg x 1, changed to high flow nasal cannula oxygen and as coordinated with PCCM, transferred to stepdown unit and handed over care to [facility] team.  As per subsequent discussion with CCM: After patient was transferred to stepdown/ICU he had a cardiac arrest, CODE BLUE was initiated, and despite CPR and full ACLS protocol he did not revive and was pronounced dead at 1636 hrs.  Acute kidney injury complicating stage IIIa CKD: Baseline creatinine 1.3.  Now likely worsening due to diuresis.  Stable in the 1.5 range for the last 2 days. Creatinine up to 1.7. Lasix discontinued. Suspecting cardiorenal syndrome.  Nephrology consulted and agreed that his acute kidney injury was likely due to acute cardiorenal syndrome versus overdiuresis.  Chronic kidney disease work-up including renal ultrasound, SPEP/UPEP, urine studies, ANA and ANCA were planned.  On 7/2, patient had ongoing minimal hemoptysis and nephrology requested anti-GBM and discussed role of biopsy and diuretics.  Type II DM with hyperglycemia: Oral hypoglycemics held.  Reasonable inpatient control on SSI.  Elevated troponin: No anginal symptoms.  May be demand ischemia.  Cardiology planned ischemic work-up as outpatient.  Anemia: Possibly of chronic disease.  Stable.   Body mass index is 23.22 kg/m.   DVT prophylaxis: Lovenox Code Status: Full Family Communication: Discussed in detail with patient spouse at bedside earlier in the morning and then when I went back to see him later in the afternoon during worsening dyspnea, updated care and answered questions Disposition:  Status is: Inpatient  Remains inpatient appropriate because:IV treatments appropriate due to intensity of illness or inability to take PO   Dispo: The patient is  from: Home              Anticipated d/c is to: Home              Anticipated d/c date is: 1 day              Patient currently is not medically stable to d/c.        Consultants:   Cardiology  Procedures:   None  Antimicrobials:   IV ceftriaxone   Subjective:  When I saw patient earlier in the morning with spouse at bedside, he indicated that his dyspnea was "80% better" although he appeared intermittently dyspneic even while speaking.  No chest pain.  Tenacious sputum and cough were somewhat better after nebulizations.  Subsequently seen at bedside at around 3:30 PM when he developed worsening dyspnea. Objective:   Vitals:   2019-12-31 1133 2019-12-31 1328 December 31, 2019 1346 12/31/2019 1411  BP: 107/74 113/73 106/67 102/75  Pulse: (!) 104   98  Resp: 19     Temp: 98.1 F (36.7 C)   99 F (37.2 C)  TempSrc: Oral   Oral  SpO2: 94% 95% 97% 93%  Weight:      Height:        General exam: Pleasant young male, moderately built and nourished sitting upright in bed, looked much worse than he did earlier in the day, tachypneic with use of accessory muscles. Respiratory system: Harsh/coarse breath sounds bilaterally with scattered crackles, occasional rhonchi.  Increased work of breathing as noted above. Cardiovascular system: S1 & S2 heard, regular  mildly tachycardic.  JVD +.  Unable to appreciate murmurs, gallops or clicks. No pedal edema.  Gastrointestinal system: Abdomen is nondistended, soft and nontender. No organomegaly or masses felt. Normal bowel sounds heard. Central nervous system: Alert and oriented. No focal neurological deficits. Extremities: Symmetric 5 x 5 power. Skin: No rashes, lesions or ulcers Psychiatry: Judgement and insight appear normal. Mood & affect appropriate.     Data Reviewed:   I have personally reviewed following labs and imaging studies   CBC: Recent Labs  Lab 12/06/19 0043 12/07/19 0551 12/28/19 1033  WBC 12.5* 10.4 10.0  NEUTROABS  --  9.4*  8.5*  HGB 10.5* 10.5* 10.7*  HCT 31.9* 31.4* 31.9*  MCV 90.9 89.5 88.9  PLT 268 301 785    Basic Metabolic Panel: Recent Labs  Lab 12/07/19 0551 12/08/19 0410 12-28-19 0553  NA 136 135 135  K 4.3 3.9 4.0  CL 95* 95* 95*  CO2 _0 GLUCOSE 187* 177* 232*  BUN 43* 51* 54*  CREATININE 1.58* 1.67* 1.55*  CALCIUM 8.7* 8.5* 8.7*  MG 2.1  --   --   PHOS 4.9*  --   --     Liver Function Tests: Recent Labs  Lab 12/07/19 0551 12/08/19 0410 12/28/2019 0553  AST _1 ALT _2 ALKPHOS 80 70 74  BILITOT 1.2 1.1 1.0  PROT 6.8 6.3* 6.6  ALBUMIN 3.6 3.4* 3.4*    CBG: Recent Labs  Lab 12/08/19 2054 Dec 28, 2019 0744 Dec 28, 2019 1134  GLUCAP 194* 236* 192*    Microbiology Studies:   Recent Results (from the past 240 hour(s))  SARS Coronavirus 2 by RT PCR (hospital order, performed in [facility] hospital lab) Nasopharyngeal Nasopharyngeal Swab     Status: None   Collection Time: 11/30/2019  5:25 AM   Specimen: Nasopharyngeal Swab  Result Value Ref Range Status   SARS Coronavirus 2 NEGATIVE NEGATIVE Final    Comment: (NOTE) SARS-CoV-2 target nucleic acids are NOT DETECTED.  The SARS-CoV-2 RNA is generally detectable in upper and lower respiratory specimens during the acute phase of infection. The lowest concentration of SARS-CoV-2 viral copies this assay can detect is 250 copies / mL. A negative result does not preclude SARS-CoV-2 infection and should not be used as the sole basis for treatment or other patient management decisions.  A negative result may occur with improper specimen collection / handling, submission of specimen other than nasopharyngeal swab, presence of viral mutation(s) within the areas targeted by this assay, and inadequate number of viral copies (<250 copies / mL). A negative result must be combined with clinical observations, patient history, and epidemiological information.  Fact Sheet for Patients:     StrictlyIdeas.no  Fact Sheet for Healthcare Providers: BankingDealers.co.za  This test is not yet approved or  cleared by the [place] FDA and has been authorized for detection and/or diagnosis of SARS-CoV-2 by FDA under an Emergency Use Authorization (EUA).  This EUA will remain in effect (meaning this test can be used) for the duration of the COVID-19 declaration under Section 564(b)(1) of the Act, 21 U.S.C. section 360bbb-3(b)(1), unless the authorization is terminated or revoked sooner.  Performed at [facility], [address] [place], [place] [address]   Blood culture (routine x 2)     Status: None (Preliminary result)   Collection Time: 11/08/2019  5:54 AM   Specimen: BLOOD  Result Value Ref Range Status   Specimen Description BLOOD LEFT AC  Final   Special Requests   Final    BOTTLES DRAWN AEROBIC AND ANAEROBIC Blood Culture results may not be optimal due to an excessive volume of blood received in culture bottles   Culture   Final    NO GROWTH 4 DAYS Performed at [facility], [address], [place], [place] [address]    Report Status PENDING  Incomplete  Blood culture (routine x 2)     Status: None (Preliminary result)   Collection Time: 11/25/2019  6:13 AM   Specimen: BLOOD  Result Value Ref Range Status   Specimen Description BLOOD RIGHT HAND  Final   Special Requests   Final    BOTTLES DRAWN AEROBIC AND ANAEROBIC Blood Culture results may not be optimal due to an inadequate volume of blood received in culture bottles   Culture   Final    NO GROWTH 4 DAYS Performed at [facility], [address] [place], [place] [address]    Report Status PENDING  Incomplete  Expectorated sputum assessment w rflx to resp cult     Status: None   Collection Time: 12/06/19  4:23 PM   Specimen: Sputum  Result Value Ref Range Status   Specimen Description SPUTUM  Final   Special Requests NONE  Final    Sputum evaluation   Final    Sputum specimen not acceptable for testing.  Please recollect.   RESULT CALLED TO, READ BACK BY AND VERIFIED WITH: Caleen Jobs 12/06/19 1712 KLW Performed at [facility], [address] [place], [place] [address]    Report Status 12/06/2019 FINAL  Final  Expectorated sputum assessment w rflx to resp cult     Status: None   Collection Time: 12/06/19 10:00 PM   Specimen: SPU  Result Value Ref Range Status   Specimen Description SPUTUM  Final   Special Requests NONE  Final   Sputum evaluation   Final    THIS SPECIMEN IS ACCEPTABLE FOR SPUTUM CULTURE Performed at [facility], [address], [place], [place] [address]    Report Status 12/07/2019 FINAL  Final  Culture, respiratory     Status: None (Preliminary result)   Collection Time: 12/06/19 10:00 PM   Specimen: SPU  Result Value Ref Range Status   Specimen Description   Final    SPUTUM Performed at [facility], [address] [place], [place] [address]    Special Requests   Final    NONE Reflexed from [phone] Performed at [facility], [address] [place], [place] [address]    Gram Stain   Final    RARE WBC PRESENT, PREDOMINANTLY MONONUCLEAR NO ORGANISMS SEEN Performed at [facility], [place] [address] [place],  [place] [address]    Culture FEW YEAST  Final   Report Status PENDING  Incomplete     Radiology Studies:  DG Chest 2 View  Result Date: 12/08/2019 CLINICAL DATA:  Cough and congestion EXAM: CHEST - 2 VIEW COMPARISON:  12/07/2019 FINDINGS: Cardiac shadow is stable. Patchy increasing infiltrates are noted particularly in the right upper lobe. Increasing bilateral effusions are noted. Central vascular congestion is again seen with interstitial edema. IMPRESSION: Changes of CHF stable from the prior exam. Increasing effusions are noted as well as patchy infiltrates bilaterally particularly in the right upper lobe. Electronically Signed    By: Inez Catalina M.D.   On: 12/08/2019 13:55   US RENAL  Result Date: 12/08/2019 CLINICAL DATA:  Chronic renal disease.  Associated acute renal disease. EXAM: RENAL / URINARY TRACT ULTRASOUND COMPLETE COMPARISON:  CT chest 11/19/2019. FINDINGS: Right Kidney: Renal measurements: 9.6 x 4.7 x 5.5 cm = volume: 124 mL . Echogenicity within normal limits. No mass or hydronephrosis visualized. Left Kidney: Renal measurements: 9.9 x 5.4 x 4.9 cm = volume: 138 mL. Echogenicity within normal limits. No mass or hydronephrosis visualized. Trace amount noted about the lower pole left kidney. Bladder: Appears normal for degree of bladder distention. Bilateral ureteral jets. Prostate is prominent. Other: 1.  Bilateral pleural effusions. IMPRESSION: 1. Trace amount of fluid noted about the lower pole left kidney. Kidneys are otherwise unremarkable. No hydronephrosis. Bilateral ureteral jets noted. 2.  Prostate is prominent.  No bladder distention. Electronically Signed   By: Marcello Moores  Register   On: 12/08/2019 13:45   DG Chest Port 1 View  Result Date: 01/07/20 CLINICAL DATA:  64 year old male status post right-sided thoracentesis EXAM: PORTABLE CHEST 1 VIEW COMPARISON:  CT a chest 11/30/2019 FINDINGS: Significant interval reduction in volume of right-sided pleural effusion. Persistent moderate left pleural effusion. No evidence of pneumothorax. Diffuse bilateral interstitial and airspace opacities in a predominantly perihilar pattern. No significant vascular congestion. IMPRESSION: No evidence of pneumothorax following right-sided thoracentesis. Resolved right pleural effusion. Persistent moderate left pleural effusion. Electronically Signed   By: Jacqulynn Cadet M.D.   On: 01/07/2020 13:59   US THORACENTESIS ASP PLEURAL SPACE W/IMG GUIDE  Result Date: 2020-01-07 INDICATION: 64 year old male with large right-sided pleural effusion EXAM: ULTRASOUND GUIDED RIGHT THORACENTESIS MEDICATIONS: None. COMPLICATIONS: None  immediate. PROCEDURE: An ultrasound guided thoracentesis was thoroughly discussed with the patient and questions answered. The benefits, risks, alternatives and complications were also discussed. The patient understands and wishes to proceed with the procedure. Written consent was obtained. Ultrasound was performed to localize and mark an adequate pocket of fluid in the right chest. The area was then prepped and draped in the normal sterile fashion. 1% Lidocaine was used for local anesthesia. Under ultrasound guidance a 6 Fr Safe-T-Centesis catheter was introduced. Thoracentesis was performed. The catheter was removed and a dressing applied. FINDINGS: A total of approximately 1500 mL of golden colored pleural fluid was removed. Samples were sent to the laboratory as requested by the clinical team. IMPRESSION: Successful ultrasound guided right thoracentesis yielding 1.5 L of pleural fluid. Electronically Signed   By: Jacqulynn Cadet M.D.   On: 01-07-2020 14:02     Scheduled Meds:   . benzonatate  200 mg Oral TID  . budesonide (PULMICORT) nebulizer solution  0.25 mg Nebulization BID  . carvedilol  3.125 mg Oral BID WC  . enoxaparin (LOVENOX) injection  40 mg Subcutaneous Q24H  . fluticasone  2 spray Each Nare Daily  . furosemide      . furosemide  80 mg Intravenous Once  . guaiFENesin  1,200 mg Oral BID  . insulin aspart  0-5 Units Subcutaneous QHS  . insulin aspart  0-9 Units Subcutaneous TID WC  . levalbuterol  1.25 mg Nebulization Q8H  . multivitamin with minerals  1 tablet Oral Daily  . pantoprazole  40 mg Oral Daily  . sacubitril-valsartan  0.5 tablet Oral BID  . sodium chloride flush  3 mL Intravenous Q12H    Continuous Infusions:   . sodium chloride    . milrinone       LOS: 4 days     Vernell Leep, MD, [place], [facility]. [facility]    To contact the attending provider between 7A-7P or the covering provider  during after hours 7P-7A, please log into the web site  [email or web address] and access using universal [place] password for that web site. If you do not have the password, please call the hospital operator.  2019-12-26, 7:08 PM

## 2020-01-08 DEATH — deceased

## 2020-01-11 LAB — FUNGUS CULTURE WITH STAIN

## 2020-01-11 LAB — FUNGUS CULTURE RESULT

## 2020-01-11 LAB — FUNGAL ORGANISM REFLEX

## 2020-01-26 LAB — ACID FAST CULTURE WITH REFLEXED SENSITIVITIES (MYCOBACTERIA): Acid Fast Culture: NEGATIVE

## 2021-10-05 IMAGING — CR DG CHEST 2V
1 series · 2 of 2 positions shown · non-contrast
Comparison: 12/05/2019

CLINICAL DATA: Cough and congestion

EXAM:
CHEST - 2 VIEW

[Series 1: dg chest 2 view · 0.14mm/px · 2 of 2 slices shown]
[im 1/2]
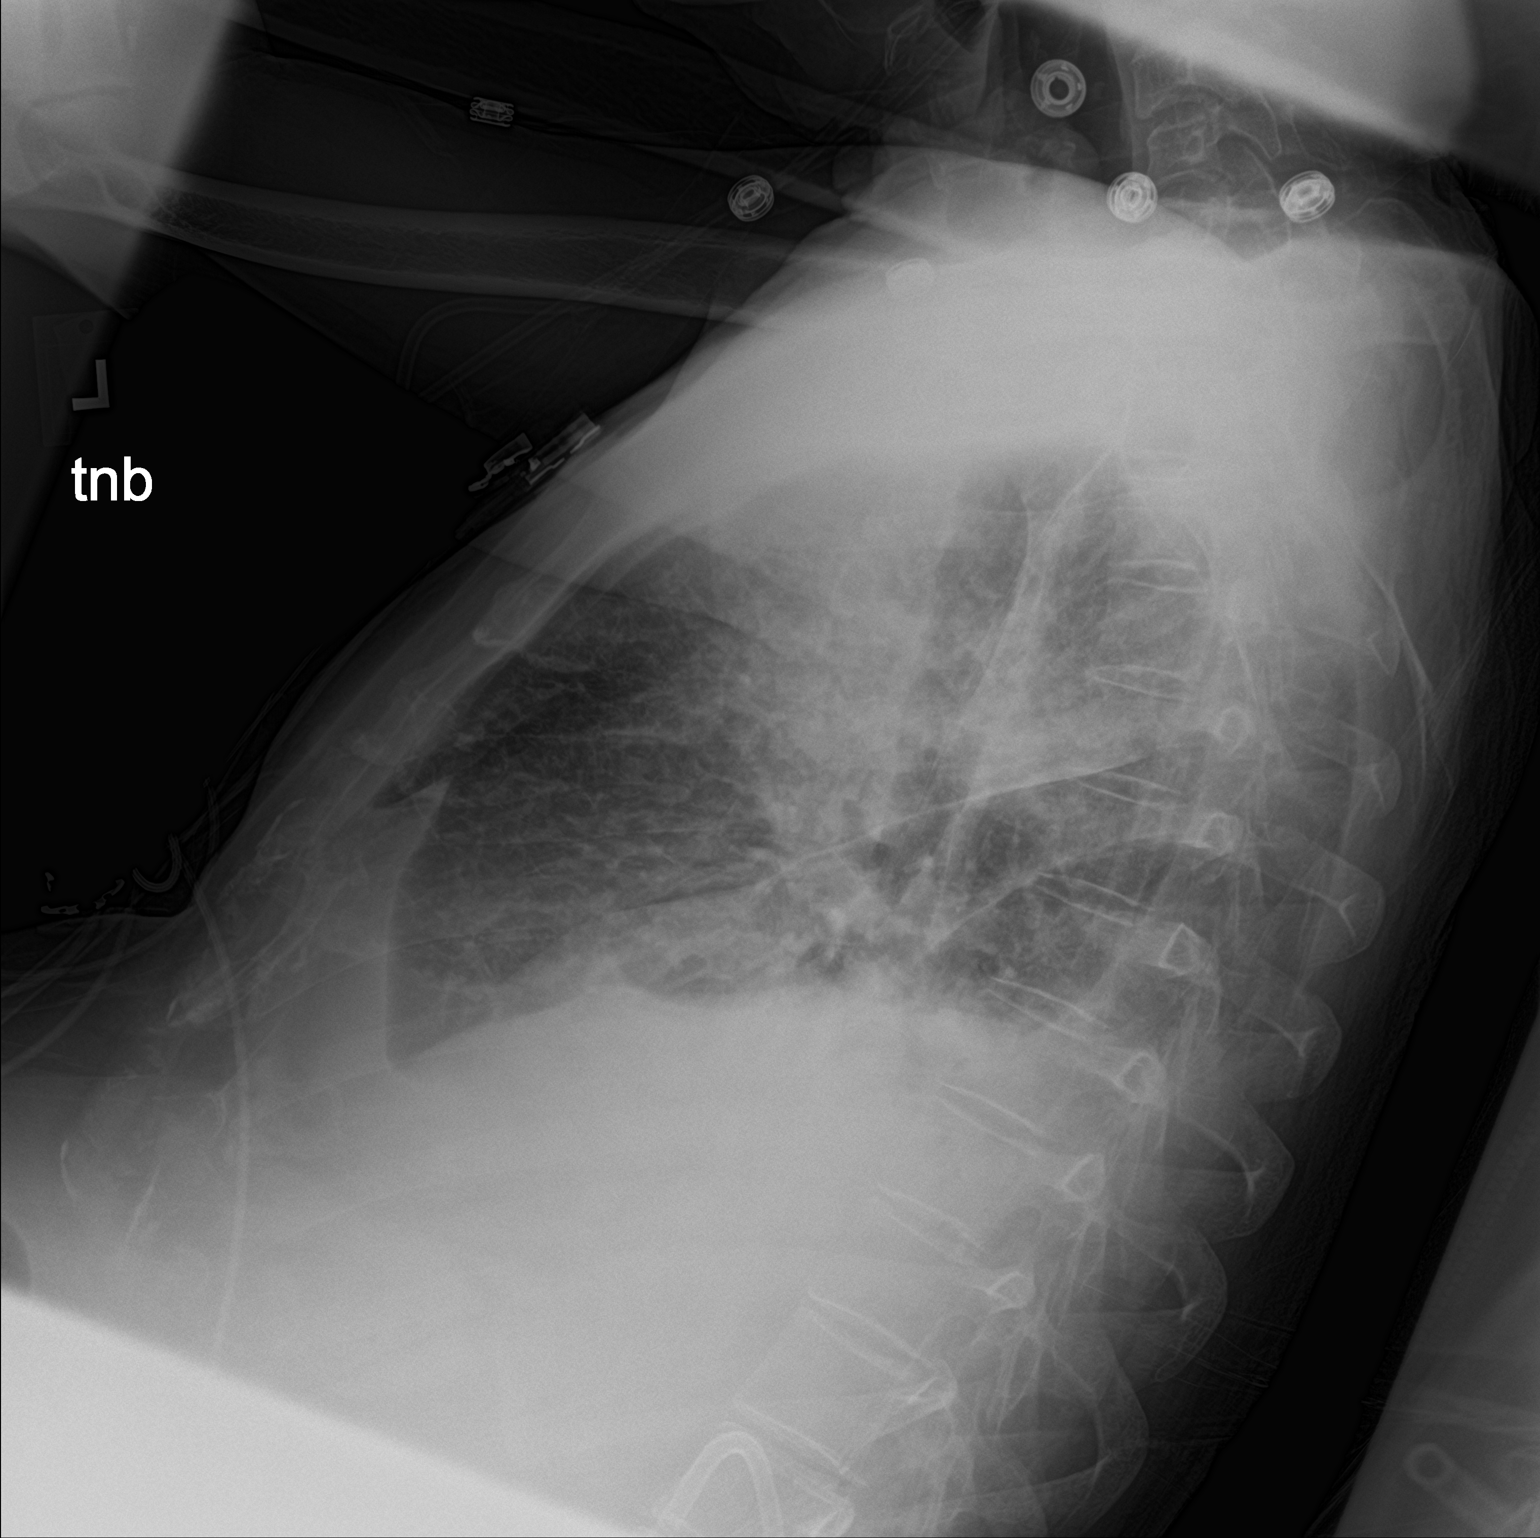
[im 2/2]
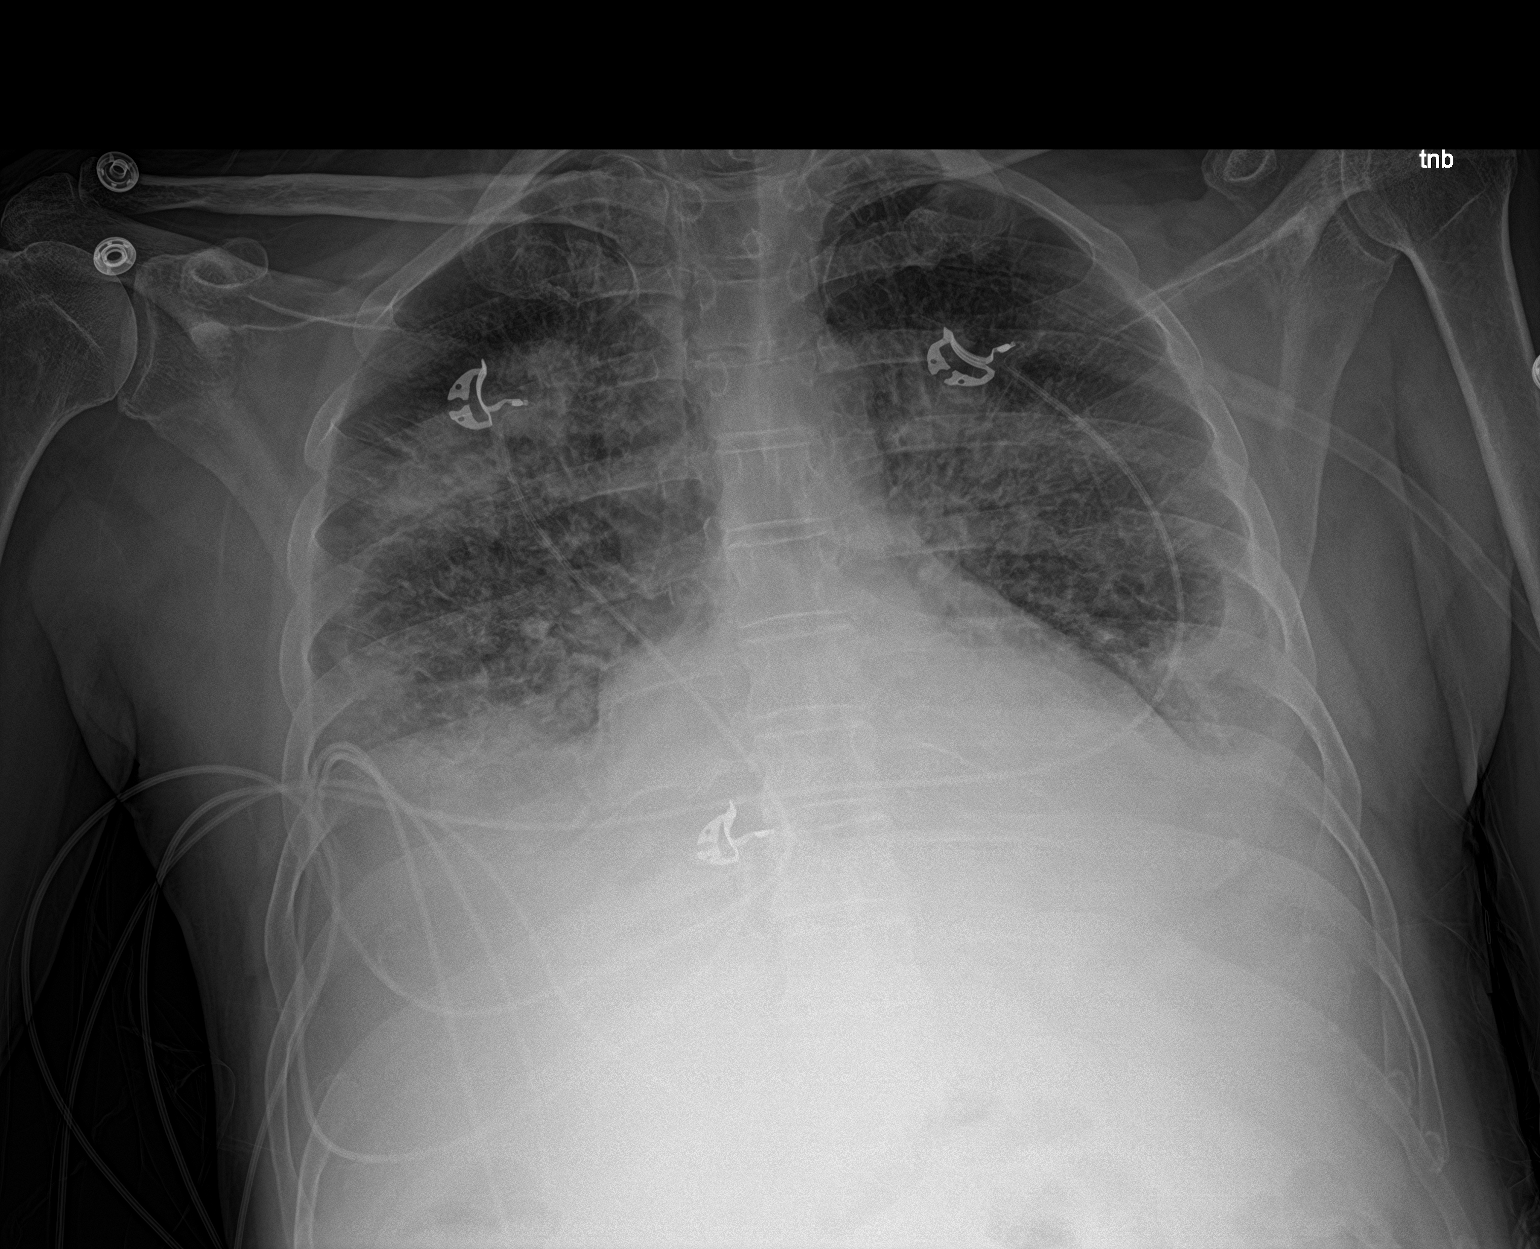

[2 of 2 positions shown; findings below may reference images not displayed]

FINDINGS: Cardiac shadow is stable. Patchy increasing infiltrates are noted
particularly in the right upper lobe. Increasing bilateral effusions
are noted. Central vascular congestion is again seen with
interstitial edema.
IMPRESSION: Changes of CHF stable from the prior exam. Increasing effusions are
noted as well as patchy infiltrates bilaterally particularly in the
right upper lobe.

## 2021-10-06 IMAGING — US US THORACENTESIS ASP PLEURAL SPACE W/IMG GUIDE
1 series · 2 of 2 positions shown · non-contrast
Comparison: none

INDICATION: 63-year-old male with large right-sided pleural effusion

[Series 1: us thoracentesis asp pleural space w/img guide · 0.25mm/px · 2 of 2 slices shown]
[im 1/2]
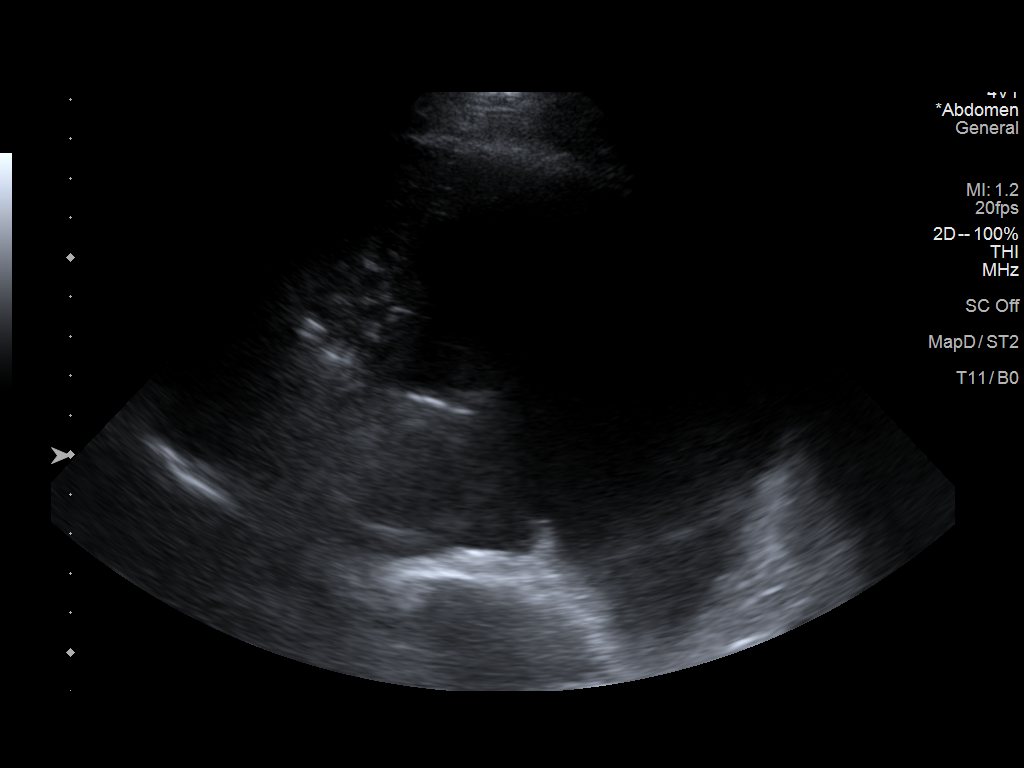
[im 2/2]
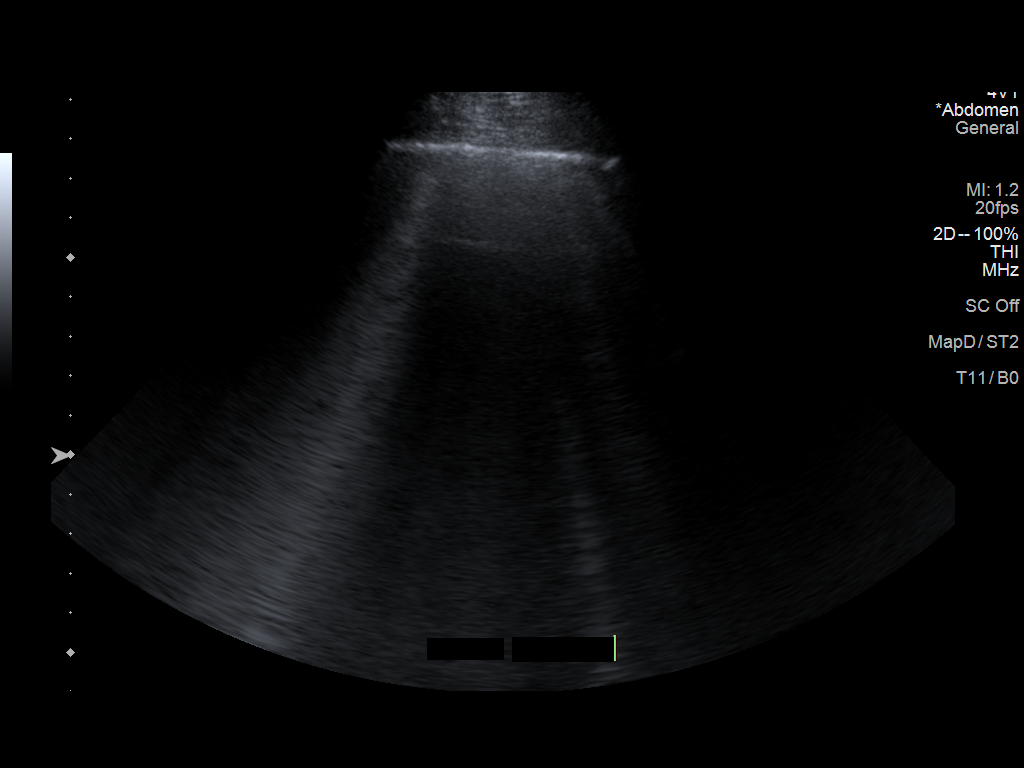

[2 of 2 positions shown; findings below may reference images not displayed]

EXAM:
ULTRASOUND GUIDED RIGHT THORACENTESIS

MEDICATIONS:
None.

COMPLICATIONS:
None immediate.

PROCEDURE:
An ultrasound guided thoracentesis was thoroughly discussed with the
patient and questions answered. The benefits, risks, alternatives
and complications were also discussed. The patient understands and
wishes to proceed with the procedure. Written consent was obtained.

Ultrasound was performed to localize and mark an adequate pocket of
fluid in the right chest. The area was then prepped and draped in
the normal sterile fashion. 1% Lidocaine was used for local
anesthesia. Under ultrasound guidance a 6 Fr Safe-T-Centesis
catheter was introduced. Thoracentesis was performed. The catheter
was removed and a dressing applied.
FINDINGS: A total of approximately 4966 mL of golden colored pleural fluid was
removed. Samples were sent to the laboratory as requested by the
clinical team.
IMPRESSION: Successful ultrasound guided right thoracentesis yielding 1.5 L of
pleural fluid.
